# Patient Record
Sex: Female | Born: 1940 | Race: White | Hispanic: No | Marital: Married | Smoking: Former smoker
Health system: Southern US, Community
[De-identification: ages and names within clinical notes are randomized; demographics above are authoritative.]

## PROBLEM LIST (undated history)

## (undated) DIAGNOSIS — I5181 Takotsubo syndrome: Secondary | ICD-10-CM

## (undated) DIAGNOSIS — I671 Cerebral aneurysm, nonruptured: Secondary | ICD-10-CM

## (undated) DIAGNOSIS — M545 Low back pain, unspecified: Secondary | ICD-10-CM

## (undated) DIAGNOSIS — M1990 Unspecified osteoarthritis, unspecified site: Secondary | ICD-10-CM

## (undated) DIAGNOSIS — I509 Heart failure, unspecified: Secondary | ICD-10-CM

## (undated) DIAGNOSIS — I10 Essential (primary) hypertension: Secondary | ICD-10-CM

## (undated) DIAGNOSIS — K589 Irritable bowel syndrome without diarrhea: Secondary | ICD-10-CM

## (undated) DIAGNOSIS — E785 Hyperlipidemia, unspecified: Secondary | ICD-10-CM

## (undated) DIAGNOSIS — E039 Hypothyroidism, unspecified: Secondary | ICD-10-CM

## (undated) DIAGNOSIS — I729 Aneurysm of unspecified site: Secondary | ICD-10-CM

## (undated) HISTORY — DX: Hyperlipidemia, unspecified: E78.5

## (undated) HISTORY — DX: Unspecified osteoarthritis, unspecified site: M19.90

## (undated) HISTORY — DX: Aneurysm of unspecified site: I72.9

## (undated) HISTORY — DX: Cerebral aneurysm, nonruptured: I67.1

## (undated) HISTORY — DX: Essential (primary) hypertension: I10

## (undated) HISTORY — DX: Heart failure, unspecified: I50.9

## (undated) HISTORY — DX: Irritable bowel syndrome, unspecified: K58.9

## (undated) HISTORY — DX: Hypothyroidism, unspecified: E03.9

## (undated) HISTORY — DX: Low back pain, unspecified: M54.50

## (undated) HISTORY — DX: Takotsubo syndrome: I51.81

## (undated) HISTORY — DX: Low back pain: M54.5

## (undated) DEATH — deceased

---
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
## 1962-05-29 HISTORY — PX: ECTOPIC PREGNANCY SURGERY: SHX613

## 1997-12-22 ENCOUNTER — Inpatient Hospital Stay (HOSPITAL_COMMUNITY): Admission: EM | Admit: 1997-12-22 | Discharge: 1997-12-22 | Payer: Self-pay | Admitting: Cardiology

## 1998-03-09 ENCOUNTER — Ambulatory Visit (HOSPITAL_BASED_OUTPATIENT_CLINIC_OR_DEPARTMENT_OTHER): Admission: RE | Admit: 1998-03-09 | Discharge: 1998-03-09 | Payer: Self-pay | Admitting: Orthopedic Surgery

## 2003-05-30 HISTORY — PX: OTHER SURGICAL HISTORY: SHX169

## 2003-05-30 LAB — HM COLONOSCOPY

## 2007-06-29 ENCOUNTER — Encounter (INDEPENDENT_AMBULATORY_CARE_PROVIDER_SITE_OTHER): Payer: Self-pay | Admitting: Internal Medicine

## 2007-06-30 HISTORY — PX: CARDIAC CATHETERIZATION: SHX172

## 2007-07-15 ENCOUNTER — Encounter (INDEPENDENT_AMBULATORY_CARE_PROVIDER_SITE_OTHER): Payer: Self-pay | Admitting: Internal Medicine

## 2008-06-08 ENCOUNTER — Ambulatory Visit: Payer: Self-pay | Admitting: Internal Medicine

## 2008-06-08 DIAGNOSIS — I671 Cerebral aneurysm, nonruptured: Secondary | ICD-10-CM

## 2008-06-08 DIAGNOSIS — M810 Age-related osteoporosis without current pathological fracture: Secondary | ICD-10-CM

## 2008-06-08 DIAGNOSIS — I10 Essential (primary) hypertension: Secondary | ICD-10-CM | POA: Insufficient documentation

## 2008-06-08 DIAGNOSIS — K589 Irritable bowel syndrome without diarrhea: Secondary | ICD-10-CM

## 2008-06-08 DIAGNOSIS — M1990 Unspecified osteoarthritis, unspecified site: Secondary | ICD-10-CM | POA: Insufficient documentation

## 2008-06-08 DIAGNOSIS — E785 Hyperlipidemia, unspecified: Secondary | ICD-10-CM | POA: Insufficient documentation

## 2008-06-08 DIAGNOSIS — E039 Hypothyroidism, unspecified: Secondary | ICD-10-CM | POA: Insufficient documentation

## 2008-06-08 DIAGNOSIS — K5900 Constipation, unspecified: Secondary | ICD-10-CM | POA: Insufficient documentation

## 2008-06-08 DIAGNOSIS — M545 Low back pain: Secondary | ICD-10-CM

## 2008-06-08 DIAGNOSIS — R1013 Epigastric pain: Secondary | ICD-10-CM

## 2008-06-12 ENCOUNTER — Encounter (INDEPENDENT_AMBULATORY_CARE_PROVIDER_SITE_OTHER): Payer: Self-pay | Admitting: Internal Medicine

## 2008-06-15 LAB — CONVERTED CEMR LAB
Albumin: 4.2 g/dL (ref 3.5–5.2)
Alkaline Phosphatase: 75 units/L (ref 39–117)
BUN: 13 mg/dL (ref 6–23)
CO2: 24 meq/L (ref 19–32)
Cholesterol: 222 mg/dL — ABNORMAL HIGH (ref 0–200)
Eosinophils Absolute: 0.3 10*3/uL (ref 0.0–0.7)
Eosinophils Relative: 4 % (ref 0–5)
Glucose, Bld: 99 mg/dL (ref 70–99)
HCT: 44.7 % (ref 36.0–46.0)
HDL: 38 mg/dL — ABNORMAL LOW (ref >39)
LDL Cholesterol: 110 mg/dL — ABNORMAL HIGH (ref 0–99)
Lymphs Abs: 2.7 10*3/uL (ref 0.7–4.0)
MCV: 94.9 fL (ref 78.0–100.0)
Monocytes Relative: 7 % (ref 3–12)
Neutrophils Relative %: 52 % (ref 43–77)
Potassium: 4.6 meq/L (ref 3.5–5.3)
RBC: 4.71 M/uL (ref 3.87–5.11)
Triglycerides: 369 mg/dL — ABNORMAL HIGH (ref <150)
WBC: 7.2 10*3/uL (ref 4.0–10.5)

## 2008-07-06 ENCOUNTER — Encounter (INDEPENDENT_AMBULATORY_CARE_PROVIDER_SITE_OTHER): Payer: Self-pay | Admitting: Internal Medicine

## 2008-07-06 ENCOUNTER — Other Ambulatory Visit: Admission: RE | Admit: 2008-07-06 | Discharge: 2008-07-06 | Payer: Self-pay | Admitting: Internal Medicine

## 2008-07-06 ENCOUNTER — Ambulatory Visit: Payer: Self-pay | Admitting: Internal Medicine

## 2008-07-06 DIAGNOSIS — L538 Other specified erythematous conditions: Secondary | ICD-10-CM | POA: Insufficient documentation

## 2008-07-08 ENCOUNTER — Telehealth (INDEPENDENT_AMBULATORY_CARE_PROVIDER_SITE_OTHER): Payer: Self-pay | Admitting: Internal Medicine

## 2008-07-10 ENCOUNTER — Ambulatory Visit (HOSPITAL_COMMUNITY): Admission: RE | Admit: 2008-07-10 | Discharge: 2008-07-10 | Payer: Self-pay | Admitting: Internal Medicine

## 2008-07-10 ENCOUNTER — Ambulatory Visit: Payer: Self-pay | Admitting: Internal Medicine

## 2008-07-10 DIAGNOSIS — I5181 Takotsubo syndrome: Secondary | ICD-10-CM | POA: Insufficient documentation

## 2008-07-24 ENCOUNTER — Ambulatory Visit: Payer: Self-pay | Admitting: Cardiology

## 2008-10-29 ENCOUNTER — Ambulatory Visit: Payer: Self-pay | Admitting: Internal Medicine

## 2008-12-11 ENCOUNTER — Encounter (INDEPENDENT_AMBULATORY_CARE_PROVIDER_SITE_OTHER): Payer: Self-pay | Admitting: *Deleted

## 2008-12-11 LAB — CONVERTED CEMR LAB
ALT: 16 units/L
AST: 17 units/L
BUN: 16 mg/dL
Cholesterol: 217 mg/dL
Creatinine, Ser: 0.78 mg/dL
Free T4: 0.86 ng/dL
Glucose, Bld: 66 mg/dL
HDL: 34 mg/dL
TSH: 4.749 microintl units/mL
Triglycerides: 335 mg/dL

## 2008-12-14 LAB — CONVERTED CEMR LAB
ALT: 16 units/L (ref 0–35)
Albumin: 4 g/dL (ref 3.5–5.2)
CO2: 21 meq/L (ref 19–32)
Calcium: 8.8 mg/dL (ref 8.4–10.5)
Chloride: 104 meq/L (ref 96–112)
Cholesterol: 217 mg/dL — ABNORMAL HIGH (ref 0–200)
Free T4: 0.86 ng/dL (ref 0.80–1.80)
Glucose, Bld: 66 mg/dL — ABNORMAL LOW (ref 70–99)
Potassium: 4.9 meq/L (ref 3.5–5.3)
Sodium: 141 meq/L (ref 135–145)
Total Bilirubin: 0.5 mg/dL (ref 0.3–1.2)
Total Protein: 7.6 g/dL (ref 6.0–8.3)
Triglycerides: 335 mg/dL — ABNORMAL HIGH (ref <150)

## 2008-12-17 ENCOUNTER — Telehealth (INDEPENDENT_AMBULATORY_CARE_PROVIDER_SITE_OTHER): Payer: Self-pay | Admitting: Internal Medicine

## 2008-12-18 ENCOUNTER — Telehealth (INDEPENDENT_AMBULATORY_CARE_PROVIDER_SITE_OTHER): Payer: Self-pay | Admitting: *Deleted

## 2008-12-21 ENCOUNTER — Telehealth (INDEPENDENT_AMBULATORY_CARE_PROVIDER_SITE_OTHER): Payer: Self-pay | Admitting: Internal Medicine

## 2009-07-19 ENCOUNTER — Encounter (INDEPENDENT_AMBULATORY_CARE_PROVIDER_SITE_OTHER): Payer: Self-pay | Admitting: *Deleted

## 2009-07-23 ENCOUNTER — Ambulatory Visit: Payer: Self-pay | Admitting: Cardiology

## 2009-12-14 ENCOUNTER — Telehealth (INDEPENDENT_AMBULATORY_CARE_PROVIDER_SITE_OTHER): Payer: Self-pay

## 2010-06-19 ENCOUNTER — Encounter: Payer: Self-pay | Admitting: Internal Medicine

## 2010-06-28 NOTE — Miscellaneous (Signed)
Summary: labs cmp,lipids,tsh,t4,12/11/2008  Clinical Lists Changes  Observations: Added new observation of CALCIUM: 8.8 mg/dL (47/82/9562 13:08) Added new observation of ALBUMIN: 4.0 g/dL (65/78/4696 29:52) Added new observation of PROTEIN, TOT: 7.6 g/dL (84/13/2440 10:27) Added new observation of SGPT (ALT): 16 units/L (12/11/2008 10:46) Added new observation of SGOT (AST): 17 units/L (12/11/2008 10:46) Added new observation of ALK PHOS: 60 units/L (12/11/2008 10:46) Added new observation of CREATININE: 0.78 mg/dL (25/36/6440 34:74) Added new observation of BUN: 16 mg/dL (25/95/6387 56:43) Added new observation of BG RANDOM: 66 mg/dL (32/95/1884 16:60) Added new observation of CO2 PLSM/SER: 21 meq/L (12/11/2008 10:46) Added new observation of CL SERUM: 104 meq/L (12/11/2008 10:46) Added new observation of K SERUM: 4.9 meq/L (12/11/2008 10:46) Added new observation of NA: 141 meq/L (12/11/2008 10:46) Added new observation of LDL: 116 mg/dL (63/05/6008 93:23) Added new observation of HDL: 34 mg/dL (55/73/2202 54:27) Added new observation of TRIGLYC TOT: 335 mg/dL (11/19/7626 31:51) Added new observation of CHOLESTEROL: 217 mg/dL (76/16/0737 10:62) Added new observation of TSH: 4.749 microintl units/mL (12/11/2008 10:46) Added new observation of T4, FREE: 0.86 ng/dL (69/48/5462 70:35)

## 2010-06-28 NOTE — Assessment & Plan Note (Signed)
Summary: PAST DUE FOR F/U/TG   Visit Type:  Follow-up Primary Provider:  DR.Pickard  CC:  no complaints today.  History of Present Illness: Jasmine Fields returns today for evaluation and management of her history of Takotsubo adenopathy.  I last saw her in February 2010.  She's having no angina or symptoms of ischemia. She states that her with her medications. Recent blood work demonstrated an elevated LDL and Dr. Tanya Nones placed her on pravastatin.    Current Medications (verified): 1)  Carvedilol 6.25 Mg Tabs (Carvedilol) .Marland Kitchen.. 1 By Mouth Two Times A Day 2)  Pravastatin Sodium 20 Mg Tabs (Pravastatin Sodium) .Marland Kitchen.. 1 By Mouth Once Daily 3)  Bumetanide 1 Mg Tabs (Bumetanide) .... 1/2 By Mouth Once Daily 4)  Lisinopril 40 Mg Tabs (Lisinopril) .Marland Kitchen.. 1 By Mouth Once Daily 5)  Levothyroxine Sodium 100 Mcg Tabs (Levothyroxine Sodium) .... Take 1 Tab Daily 6)  Amitriptyline Hcl 10 Mg Tabs (Amitriptyline Hcl) .Marland Kitchen.. 1 By Mouth At Bedtime As Needed Sleep 7)  [facility] Aspirin 81 Mg Tbec (Aspirin) .Marland Kitchen.. 1 By Mouth Once Daily 8)  Correctol 5 Mg Tbec (Bisacodyl) .Marland Kitchen.. 1 By Mouth As Needed 9)  Nystatin-Triamcinolone 100000-0.1 Unit/gm-% Crea (Nystatin-Triamcinolone) .... Apply To Rash Two Times A Day For 2 Weeks 10)  Vitamin D 1000 Unit Tabs (Cholecalciferol) .... Take 1 Tab Daily  Allergies (verified): No Known Drug Allergies  Past History:  Past Medical History: Last updated: 07/10/2008 Hypertension Hypothyroidism Low back pain Osteoarthritis IBS constipation aneurysm-cerebral artery Osteoporosis Hyperlipidemia Takotsubo's Cardiomyopathy  Past Surgical History: Last updated: 05-Jul-2008 cerebral artery coil-2005 ectopic pregnancy--right SOO-1964 Cholecystectomy-1960 Cardiac Cath--Feb 2009--normal arteries  Family History: Last updated: 07-05-2008 father-deceased-69-heart, diabetes mother-deceased-59-cervical cancer brother-73-in nursing home-alzheimers, CVA  x2 brother-70-HTN sister-63- son-46  Social History: Last updated: 05-Jul-2008 Married lives with spouse Former Smoker-1.5ppd x30 years, quit 2001 Alcohol use-no Drug use-no  Risk Factors: Smoking Status: quit (07-05-08)  Review of Systems       negative other than history of present illness  Vital Signs:  Patient profile:   70 year old female Height:      65 inches Weight:      217 pounds BMI:     36.24 Pulse rate:   61 / minute BP sitting:   121 / 73  (right arm)  Vitals Entered By: Dreama Saa, CNA (July 23, 2009 1:06 PM)  Physical Exam  General:  obese.   Head:  normocephalic and atraumatic Eyes:  PERRLA/EOM intact; conjunctiva and lids normal. Neck:  Neck supple, no JVD. No masses, thyromegaly or abnormal cervical nodes. Chest Jasmine Fields:  no deformities or breast masses noted Lungs:  Clear bilaterally to auscultation and percussion. Heart:  Non-displaced PMI, chest non-tender; regular rate and rhythm, S1, S2 without murmurs, rubs or gallops. Carotid upstroke normal, no bruit. Normal abdominal aortic size, no bruits. Femorals normal pulses, no bruits. Pedals normal pulses. No edema, no varicosities. Msk:  Back normal, normal gait. Muscle strength and tone normal. Pulses:  pulses normal in all 4 extremities Extremities:  No clubbing or cyanosis. Neurologic:  Alert and oriented x 3. Skin:  Intact without lesions or rashes. Psych:  Normal affect.   Impression & Recommendations:  Problem # 1:  TAKOTSUBO SYNDROME (ICD-429.83) Assessment Unchanged  Problem # 2:  HYPERLIPIDEMIA (ICD-272.4) Assessment: Improved  The following medications were removed from the medication list:    Gemfibrozil 600 Mg Tabs (Gemfibrozil) .Marland Kitchen... Take 1 tablet by mouth two times a day Her updated medication list for this problem  includes:    Pravastatin Sodium 20 Mg Tabs (Pravastatin sodium) .Marland Kitchen... 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  Problem # 4:  HYPERTENSION (ICD-401.9) Assessment: Improved  Her updated medication list for this problem includes:    Carvedilol 6.25 Mg Tabs (Carvedilol) .Marland Kitchen... 1 by mouth two times a day    Bumetanide 1 Mg Tabs (Bumetanide) .Marland Kitchen... 1/2 by mouth once daily    Lisinopril 40 Mg Tabs (Lisinopril) .Marland Kitchen... 1 by mouth once daily    [facility] Aspirin 81 Mg Tbec (Aspirin) .Marland Kitchen... 1 by mouth once daily  Patient Instructions: 1)  Your physician recommends that you schedule a follow-up appointment in: 1 year 2)  Your physician recommends that you continue on your current medications as directed. Please refer to the Current Medication list given to you today.

## 2010-06-28 NOTE — Progress Notes (Signed)
Summary: chest discomfort  Phone Note Call from Patient Call back at Home Phone [phone] Call back at [phone]   Caller: pt Reason for Call: Talk to Nurse Summary of Call: per pt call has been having tighness in chest and discomfort in middle of chest. she has been under stress for a couple days. Initial call taken by: Faythe Ghee,  December 14, 2009 9:06 AM  Follow-up for Phone Call        Pt. advised to go to ER if discomfort continues or worsens. Pt. offered first available appt. with Dr. Daleen Squibb or Joni Reining, NP which is in mid Aug. Mrs. Stinnette wanted to be seen sooner and when we could not get her in she stated she would come get her records.  Follow-up by: Larita Fife Via LPN,  December 14, 2009 1:31 PM

## 2010-08-10 ENCOUNTER — Encounter: Payer: Self-pay | Admitting: Physician Assistant

## 2010-08-10 ENCOUNTER — Ambulatory Visit (INDEPENDENT_AMBULATORY_CARE_PROVIDER_SITE_OTHER): Payer: 59 | Admitting: Cardiology

## 2010-08-10 DIAGNOSIS — I5181 Takotsubo syndrome: Secondary | ICD-10-CM

## 2010-08-10 DIAGNOSIS — E785 Hyperlipidemia, unspecified: Secondary | ICD-10-CM

## 2010-08-13 ENCOUNTER — Encounter: Payer: Self-pay | Admitting: Family Medicine

## 2010-08-13 DIAGNOSIS — Z9049 Acquired absence of other specified parts of digestive tract: Secondary | ICD-10-CM | POA: Insufficient documentation

## 2010-08-13 DIAGNOSIS — I671 Cerebral aneurysm, nonruptured: Secondary | ICD-10-CM

## 2010-08-13 DIAGNOSIS — I5181 Takotsubo syndrome: Secondary | ICD-10-CM
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
## 2010-11-18 ENCOUNTER — Encounter: Payer: Self-pay | Admitting: Family Medicine

## 2010-11-18 DIAGNOSIS — I10 Essential (primary) hypertension: Secondary | ICD-10-CM | POA: Insufficient documentation

## 2010-11-18 DIAGNOSIS — E785 Hyperlipidemia, unspecified: Secondary | ICD-10-CM | POA: Insufficient documentation

## 2010-11-18 DIAGNOSIS — E039 Hypothyroidism, unspecified: Secondary | ICD-10-CM | POA: Insufficient documentation

## 2010-11-18 DIAGNOSIS — I509 Heart failure, unspecified: Secondary | ICD-10-CM | POA: Insufficient documentation

## 2010-11-18 DIAGNOSIS — I729 Aneurysm of unspecified site: Secondary | ICD-10-CM | POA: Insufficient documentation

## 2010-12-08 ENCOUNTER — Encounter: Payer: Self-pay | Admitting: Family Medicine

## 2011-03-03 ENCOUNTER — Encounter: Payer: Self-pay | Admitting: Cardiology

## 2011-03-06 ENCOUNTER — Encounter: Payer: Self-pay | Admitting: Cardiology

## 2011-10-17 ENCOUNTER — Telehealth: Payer: Self-pay | Admitting: *Deleted

## 2011-10-17 NOTE — Telephone Encounter (Signed)
Received TFC from patient, who states nausea, sweating and left arm pain and numbness.  Advised patient to be taken to the nearest emergency department for evaluation, due to the nature of her symptoms.

## 2011-10-18 DIAGNOSIS — R079 Chest pain, unspecified: Secondary | ICD-10-CM

## 2011-10-26 ENCOUNTER — Ambulatory Visit (INDEPENDENT_AMBULATORY_CARE_PROVIDER_SITE_OTHER): Payer: 59 | Admitting: Adult Health

## 2011-10-26 ENCOUNTER — Encounter: Payer: Self-pay | Admitting: Adult Health

## 2011-10-26 VITALS — BP 142/73 | HR 59 | Resp 16 | Ht 65.0 in | Wt 199.0 lb

## 2011-10-26 DIAGNOSIS — I10 Essential (primary) hypertension: Secondary | ICD-10-CM

## 2011-10-26 DIAGNOSIS — I5181 Takotsubo syndrome: Secondary | ICD-10-CM

## 2011-10-26 MED ORDER — NITROGLYCERIN 0.4 MG SL SUBL: 0.4000 mg | SUBLINGUAL_TABLET | SUBLINGUAL | Status: DC | PRN
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
## 2011-10-26 NOTE — Assessment & Plan Note (Addendum)
She continues on coreg 6.25 mg BID. I have added NTG to her medication regimen for angina symptoms, although what she has described on recent admission appears to be more musculoskeletal in etiology. Recommend cervical spine evaluation. She continues tense at home with her husband's illness being treated for leukemia, with mood swings associated with chemo. She is going to talk to her PCP about antianxiety medications on next appointment.

## 2011-10-26 NOTE — Patient Instructions (Signed)
Your physician recommends that you schedule a follow-up appointment in: 6 months  

## 2011-10-26 NOTE — Assessment & Plan Note (Signed)
BP is mildly elevated on this office visit. Will continue to monitor. If remains elevated, may consider adjusting medications for better control with goal of 135/60. She verbalizes understanding. Will continue lisinopril 40 mg as directed.Most recent labs at [facility] Creatinine 0.91, potassium 4.1.

## 2011-11-14 ENCOUNTER — Ambulatory Visit: Payer: 59 | Admitting: Cardiology

## 2012-05-13 ENCOUNTER — Ambulatory Visit: Payer: 59 | Admitting: Adult Health

## 2012-05-20 ENCOUNTER — Ambulatory Visit: Payer: 59 | Admitting: Adult Health

## 2012-06-05 ENCOUNTER — Ambulatory Visit: Payer: 59 | Admitting: Adult Health

## 2012-06-07 ENCOUNTER — Encounter: Payer: Self-pay | Admitting: Adult Health

## 2012-06-07 ENCOUNTER — Ambulatory Visit (INDEPENDENT_AMBULATORY_CARE_PROVIDER_SITE_OTHER): Payer: 59 | Admitting: Adult Health

## 2012-06-07 VITALS — BP 118/85 | HR 66 | Ht 64.5 in | Wt 200.5 lb

## 2012-06-07 DIAGNOSIS — E785 Hyperlipidemia, unspecified: Secondary | ICD-10-CM

## 2012-06-07 DIAGNOSIS — I10 Essential (primary) hypertension: Secondary | ICD-10-CM

## 2012-06-07 DIAGNOSIS — I5181 Takotsubo syndrome: Secondary | ICD-10-CM

## 2012-06-07 NOTE — Addendum Note (Signed)
Addended by: Reather Laurence A on: 06/07/2012 04:24 PM   Modules accepted: Orders

## 2012-06-07 NOTE — Progress Notes (Signed)
HPI: Jasmine Fields is a 72 y/o patietn of Dr. Daleen Squibb we are following  For ongoing assessment and treatment for Taku-Tsubo cardiomyopathy, hypertension, and dyslipidemia with history of hypothyroidism. She comes today without complaint of chest discomfort. She continues to be depressed and anxious, and is caring for her husband who is on chemo.She states she has family support who are helpful. She is medically complaint and has had no ER visits or hospitalizations since being seen in clinic last. She is followed by Dr.Pickard for PCP needs and has had CBC and lipid profile completed in December, no documentation of BMET. .  Allergies  Allergen Reactions  . Alendronate Sodium     Upset stomach   . Amlodipine     swelling  . Boniva (Ibandronate Sodium)     Upset stomach  . Latex     Current Outpatient Prescriptions  Medication Sig Dispense Refill  . amitriptyline (ELAVIL) 10 MG tablet Take 10 mg by mouth at bedtime.        Marland Kitchen aspirin 81 MG tablet Take 81 mg by mouth daily.        . bumetanide (BUMEX) 1 MG tablet Take 1 mg by mouth daily. 1/2 pill a day       . carvedilol (COREG) 6.25 MG tablet Take 6.25 mg by mouth 2 (two) times daily with a meal.        . Cholecalciferol (VITAMIN D3) 2000 UNITS TABS Take by mouth daily.        Marland Kitchen levothyroxine (SYNTHROID, LEVOTHROID) 100 MCG tablet Take 100 mcg by mouth daily.        Marland Kitchen lisinopril (PRINIVIL,ZESTRIL) 40 MG tablet Take 40 mg by mouth daily.        . nitroGLYCERIN (NITROSTAT) 0.4 MG SL tablet Place 1 tablet (0.4 mg total) under the tongue every 5 (five) minutes as needed.  100 tablet  0  . simvastatin (ZOCOR) 40 MG tablet Take 40 mg by mouth at bedtime.          Past Medical History  Diagnosis Date  . Hypothyroidism   . Aneurysm   . Hypertension   . Hyperlipidemia   . CHF (congestive heart failure)   . Low back pain   . Osteoarthritis   . IBS (irritable bowel syndrome)     With constipation  . Aneurysm of anterior cerebral artery   .  Osteoporosis   . Takotsubo cardiomyopathy     Past Surgical History  Procedure Date  . Cerebral artery coil 2005  . Ectopic pregnancy surgery 1964    Right, SOO  . Cholecystectomy 1960  . Cardiac catheterization 06/2007    Normal arteries    RUE:AVWUJW of systems complete and found to be negative unless listed above  PHYSICAL EXAM BP 118/85  Pulse 66  Ht 5' 4.5" (1.638 m)  Wt 200 lb 8 oz (90.946 kg)  BMI 33.88 kg/m2  SpO2 96%  General: Well developed, well nourished, in no acute distress Head: Eyes PERRLA, No xanthomas.   Normal cephalic and atramatic  Lungs: Clear bilaterally to auscultation and percussion. Heart: HRRR S1 S2, without MRG.  Pulses are 2+ & equal.            No carotid bruit. No JVD.  No abdominal bruits. No femoral bruits. Abdomen: Bowel sounds are positive, abdomen soft and non-tender without masses or                  Hernia's noted. Msk:  Back normal,  normal gait. Normal strength and tone for age. Extremities: No clubbing, cyanosis or edema.  DP +1 Neuro: Alert and oriented X 3. Psych:  Flat affect, responds appropriately  EKG:NSR with T-wave inversion in the precordial leads.  ASSESSMENT AND PLAN

## 2012-06-07 NOTE — Patient Instructions (Addendum)
Your physician recommends that you schedule a follow-up appointment in: 6 months  ADDENDUM - BMET - PT WILL BE NOTIFIED BY PHONE

## 2012-06-07 NOTE — Assessment & Plan Note (Signed)
She offers no recurrence of symptoms since being seen in the office last, with no hospitalizations. She will continue coreg and prn nitrates.

## 2012-06-07 NOTE — Assessment & Plan Note (Signed)
Labs from Dec 2013  TC: 179, TG 258, HDL 25 LDL 91.  She is to have CMET drawn for evaluation of liver fx as we do not have documentation of this with lipids from Dr. Caren Macadam office.

## 2012-06-07 NOTE — Assessment & Plan Note (Signed)
Blood pressure is well controlled at present, the best I have seen it. She will remain on lisinopril, bur I will have BMET checked for kidney function along with lytes. She will be seen again in 6 months unless she is symptomatic.

## 2012-06-07 NOTE — Progress Notes (Deleted)
Name: Jasmine Fields    DOB: 1940/06/14  Age: 72 y.o.  MR#: [id number]       PCP:  Leo Grosser, MD      Insurance: @PAYORNAME @   CC:   NOTES UNDER A LOT OF STRESS PER SON IN HOSPITAL [place] 5 DAYS AGO, NOTED DIZZY SPELLS STARTED 2-3 DAYS AGO BID WHILE SITTING OR STANDING TO WALK  VS BP 120/87  Pulse 64  Ht 5' 4.5" (1.638 m)  Wt 200 lb 8 oz (90.946 kg)  BMI 33.88 kg/m2  SpO2 96%  Weights Current Weight  06/07/12 200 lb 8 oz (90.946 kg)  10/26/11 199 lb (90.266 kg)  08/10/10 196 lb (88.905 kg)    Blood Pressure  BP Readings from Last 3 Encounters:  06/07/12 120/87  10/26/11 142/73  08/10/10 137/81     Admit date:  (Not on file) Last encounter with RMR:  Visit date not found   Allergy Allergies  Allergen Reactions  . Alendronate Sodium     Upset stomach   . Amlodipine     swelling  . Boniva (Ibandronate Sodium)     Upset stomach  . Latex     Current Outpatient Prescriptions  Medication Sig Dispense Refill  . amitriptyline (ELAVIL) 10 MG tablet Take 10 mg by mouth at bedtime.        Marland Kitchen aspirin 81 MG tablet Take 81 mg by mouth daily.        . bumetanide (BUMEX) 1 MG tablet Take 1 mg by mouth daily. 1/2 pill a day       . carvedilol (COREG) 6.25 MG tablet Take 6.25 mg by mouth 2 (two) times daily with a meal.        . Cholecalciferol (VITAMIN D3) 2000 UNITS TABS Take by mouth daily.        Marland Kitchen levothyroxine (SYNTHROID, LEVOTHROID) 100 MCG tablet Take 100 mcg by mouth daily.        Marland Kitchen lisinopril (PRINIVIL,ZESTRIL) 40 MG tablet Take 40 mg by mouth daily.        . nitroGLYCERIN (NITROSTAT) 0.4 MG SL tablet Place 1 tablet (0.4 mg total) under the tongue every 5 (five) minutes as needed.  100 tablet  0  . simvastatin (ZOCOR) 40 MG tablet Take 40 mg by mouth at bedtime.          Discontinued Meds:   There are no discontinued medications.  Patient Active Problem List  Diagnosis  . HYPOTHYROIDISM  . HYPERLIPIDEMIA  . HYPERTENSION  . Takotsubo syndrome  . Cerebral  aneurysm, nonruptured  . CONSTIPATION  . IRRITABLE BOWEL SYNDROME  . INTERTRIGO, CANDIDAL  . OSTEOARTHRITIS  . LOW BACK PAIN  . POSTMENOPAUSAL OSTEOPOROSIS  . EPIGASTRIC PAIN  . History of cholecystectomy  . Hypothyroidism  . Aneurysm  . Hypertension  . Hyperlipidemia  . CHF (congestive heart failure)    LABS No visits with results within 3 Month(s) from this visit. Latest known visit with results is:  Abstract on 11/18/2010  Component Date Value  . HM Mammogram 03/29/2010 ..   . HM Colonoscopy 05/30/2003 ..   . HM Dexa Scan 05/29/2001 .Marland Kitchen      Results for this Opt Visit:     Results for orders placed in visit on 11/18/10  HM MAMMOGRAPHY      Component Value Range   HM Mammogram ..    HM COLONOSCOPY      Component Value Range   HM Colonoscopy .Marland Kitchen    HM  DEXA SCAN      Component Value Range   HM Dexa Scan .Marland Kitchen      EKG Orders placed in visit on 06/07/12  . EKG 12-LEAD     Prior Assessment and Plan Problem List as of 06/07/2012          HYPOTHYROIDISM   HYPERLIPIDEMIA   HYPERTENSION   Last Assessment & Plan Note   10/26/2011 Office Visit Signed 10/26/2011  1:44 PM by Jodelle Gross, NP    BP is mildly elevated on this office visit. Will continue to monitor. If remains elevated, may consider adjusting medications for better control with goal of 135/60. She verbalizes understanding. Will continue lisinopril 40 mg as directed.Most recent labs at [facility] Creatinine 0.91, potassium 4.1.     Takotsubo syndrome   Last Assessment & Plan Note   10/26/2011 Office Visit Addendum 10/26/2011  1:46 PM by Jodelle Gross, NP    She continues on coreg 6.25 mg BID. I have added NTG to her medication regimen for angina symptoms, although what she has described on recent admission appears to be more musculoskeletal in etiology. Recommend cervical spine evaluation. She continues tense at home with her husband's illness being treated for leukemia, with mood swings associated with  chemo. She is going to talk to her PCP about antianxiety medications on next appointment.    Cerebral aneurysm, nonruptured   CONSTIPATION   IRRITABLE BOWEL SYNDROME   INTERTRIGO, CANDIDAL   OSTEOARTHRITIS   LOW BACK PAIN   POSTMENOPAUSAL OSTEOPOROSIS   EPIGASTRIC PAIN   History of cholecystectomy   Hypothyroidism   Aneurysm   Hypertension   Hyperlipidemia   CHF (congestive heart failure)       Imaging: No results found.   FRS Calculation: Score not calculated. Missing: Total Cholesterol

## 2012-06-08 LAB — BASIC METABOLIC PANEL
BUN: 16 mg/dL (ref 6–23)
CO2: 27 mEq/L (ref 19–32)
Chloride: 102 mEq/L (ref 96–112)
Glucose, Bld: 102 mg/dL — ABNORMAL HIGH (ref 70–99)
Potassium: 4 mEq/L (ref 3.5–5.3)

## 2012-06-10 ENCOUNTER — Encounter: Payer: Self-pay | Admitting: *Deleted

## 2012-09-16 ENCOUNTER — Ambulatory Visit (INDEPENDENT_AMBULATORY_CARE_PROVIDER_SITE_OTHER): Payer: Self-pay | Admitting: Physician Assistant

## 2012-09-16 ENCOUNTER — Encounter: Payer: Self-pay | Admitting: Physician Assistant

## 2012-09-16 VITALS — BP 162/90 | HR 80 | Temp 98.1°F | Resp 18 | Ht 62.75 in | Wt 197.0 lb

## 2012-09-16 DIAGNOSIS — A499 Bacterial infection, unspecified: Secondary | ICD-10-CM

## 2012-09-16 DIAGNOSIS — J988 Other specified respiratory disorders: Secondary | ICD-10-CM

## 2012-09-16 DIAGNOSIS — B9689 Other specified bacterial agents as the cause of diseases classified elsewhere: Secondary | ICD-10-CM

## 2012-09-16 MED ORDER — BENZONATATE 200 MG PO CAPS: 200.0000 mg | ORAL_CAPSULE | Freq: Two times a day (BID) | ORAL | Status: DC | PRN

## 2012-09-16 MED ORDER — PREDNISONE 20 MG PO TABS: ORAL_TABLET | ORAL | Status: DC

## 2012-09-16 MED ORDER — AZITHROMYCIN 250 MG PO TABS: ORAL_TABLET | ORAL | Status: DC

## 2012-09-16 NOTE — Progress Notes (Signed)
Patient ID: CYLA HALUSKA MRN: [id number], DOB: 06-Sep-1940, 72 y.o. Date of Encounter: 09/16/2012, 12:17 PM    Chief Complaint:  Chief Complaint  Patient presents with  . sick  ?bronchitits    in [place] ED over weekend  gave cough med and penicilin at ED but made her sick so stopped taking     HPI: 72 y.o. year old female says these symptoms started 7 days ago. Hoarseness, Cough productive of thick dark phlegm. Also head/nasal congestion with thick yellow mucus. Had some sore throat but this has resolved except some sore throat when has cough. No known fever or chills. Went to [facility] ER over weekend-they prescribed penicillin and cough med with hydrocodone. She took both meds within one hour of each other. After taking both meds, developed vomiting and diarrhea so stopped both meds. Not sure which med caused the v/d. No h/o known rxn to either previously.   Home Meds: Current Outpatient Prescriptions on File Prior to Visit  Medication Sig Dispense Refill  . amitriptyline (ELAVIL) 10 MG tablet Take 10 mg by mouth at bedtime.        Marland Kitchen aspirin 81 MG tablet Take 81 mg by mouth daily.        . bumetanide (BUMEX) 1 MG tablet Take 1 mg by mouth daily. 1/2 pill a day       . carvedilol (COREG) 6.25 MG tablet Take 6.25 mg by mouth 2 (two) times daily with a meal.        . levothyroxine (SYNTHROID, LEVOTHROID) 100 MCG tablet Take 100 mcg by mouth daily.        Marland Kitchen lisinopril (PRINIVIL,ZESTRIL) 40 MG tablet Take 40 mg by mouth daily.        . nitroGLYCERIN (NITROSTAT) 0.4 MG SL tablet Place 1 tablet (0.4 mg total) under the tongue every 5 (five) minutes as needed.  100 tablet  0  . simvastatin (ZOCOR) 40 MG tablet Take 40 mg by mouth at bedtime.        . Cholecalciferol (VITAMIN D3) 2000 UNITS TABS Take by mouth daily.         No current facility-administered medications on file prior to visit.    Allergies:  Allergies  Allergen Reactions  . Alendronate Sodium     Upset stomach    . Amlodipine     swelling  . Boniva (Ibandronate Sodium)     Upset stomach  . Latex       Review of Systems:See HPI for pertinent ros. All others negative.   Physical Exam: Blood pressure 162/90, pulse 80, temperature 98.1 F (36.7 C), temperature source Oral, resp. rate 18, height 5' 2.75" (1.594 m), weight 197 lb (89.359 kg)., Body mass index is 35.17 kg/(m^2).SAO2 on RA at Rest; 94-98% (fluctuates b/t these) General: Well developed, well nourished,WF in no acute distress. HEENT: Normocephalic, atraumatic, eyes without discharge, sclera non-icteric, nares are without discharge. Bilateral auditory canals clear, TM's are without perforation, pearly grey and translucent with reflective cone of light bilaterally. Oral cavity moist, posterior pharynx without exudate, erythema, peritonsillar abscess, or post nasal drip.No tenderness with percussion of sinuses.  Neck: Supple. No thyromegaly. Full ROM. No lymphadenopathy. Lungs: Every time she takes a deep breath, this causes cough. She has harsh coarse breath sounds throughout with some slight wheezes. No dyspnea. No use of accessory muscles.  Heart: Regular rhythm. No murmurs, rubs, or gallops. Msk:  Strength and tone normal for age. Extremities/Skin: Warm and dry. No clubbing or cyanosis.  No edema. No rashes or suspicious lesions. Neuro: Alert and oriented X 3. Moves all extremities spontaneously. Gait is normal. CNII-XII grossly in tact. Psych:  Responds to questions appropriately with a normal affect.     ASSESSMENT AND PLAN:  72 y.o. year old female with  1. Bacterial respiratory infection - azithromycin (ZITHROMAX) 250 MG tablet; ADay 1 : Take 2. Days 2-5: Take 1 daily  Dispense: 6 tablet; Refill: 0 - benzonatate (TESSALON) 200 MG capsule; Take 1 capsule (200 mg total) by mouth 2 (two) times daily as needed for cough.  Dispense: 20 capsule; Refill: 0 - predniSONE (DELTASONE) 20 MG tablet; Take 3 daily for 2 days, then 2 daily for 2  days, then 1 daily for 2 days.  Dispense: 12 tablet; Refill: 0 Mucinex DM during day-Gave her some samples.   2-HTN: Uncontrolled. She reports that she has not taken BP med for past 2 days b/c of being sick. Discussed with her-must take daily. She is to go home and take dose now then daily.  If worsens or if does not resolve f/u.  [address] [place], [place], [facility] 09/16/2012 12:17 PM

## 2012-09-22 ENCOUNTER — Inpatient Hospital Stay (HOSPITAL_COMMUNITY)
Admission: EM | Admit: 2012-09-22 | Discharge: 2012-09-26 | DRG: 153 | Disposition: A | Discharge status: Home / Self Care | Payer: Medicare PPO | Attending: Otolaryngology | Admitting: Otolaryngology

## 2012-09-22 ENCOUNTER — Emergency Department (HOSPITAL_COMMUNITY): Payer: Medicare PPO

## 2012-09-22 ENCOUNTER — Encounter (HOSPITAL_COMMUNITY): Payer: Self-pay | Admitting: Emergency Medicine

## 2012-09-22 DIAGNOSIS — Z7982 Long term (current) use of aspirin: Secondary | ICD-10-CM

## 2012-09-22 DIAGNOSIS — M1990 Unspecified osteoarthritis, unspecified site: Secondary | ICD-10-CM | POA: Diagnosis present

## 2012-09-22 DIAGNOSIS — K589 Irritable bowel syndrome without diarrhea: Secondary | ICD-10-CM | POA: Diagnosis present

## 2012-09-22 DIAGNOSIS — I509 Heart failure, unspecified: Secondary | ICD-10-CM | POA: Diagnosis present

## 2012-09-22 DIAGNOSIS — Z87891 Personal history of nicotine dependence: Secondary | ICD-10-CM

## 2012-09-22 DIAGNOSIS — I5181 Takotsubo syndrome: Secondary | ICD-10-CM | POA: Diagnosis present

## 2012-09-22 DIAGNOSIS — J029 Acute pharyngitis, unspecified: Principal | ICD-10-CM | POA: Diagnosis present

## 2012-09-22 DIAGNOSIS — Z8249 Family history of ischemic heart disease and other diseases of the circulatory system: Secondary | ICD-10-CM

## 2012-09-22 DIAGNOSIS — I10 Essential (primary) hypertension: Secondary | ICD-10-CM | POA: Diagnosis present

## 2012-09-22 DIAGNOSIS — Z833 Family history of diabetes mellitus: Secondary | ICD-10-CM

## 2012-09-22 DIAGNOSIS — E785 Hyperlipidemia, unspecified: Secondary | ICD-10-CM | POA: Diagnosis present

## 2012-09-22 DIAGNOSIS — Z823 Family history of stroke: Secondary | ICD-10-CM

## 2012-09-22 DIAGNOSIS — K1120 Sialoadenitis, unspecified: Secondary | ICD-10-CM | POA: Diagnosis present

## 2012-09-22 DIAGNOSIS — K5900 Constipation, unspecified: Secondary | ICD-10-CM | POA: Diagnosis present

## 2012-09-22 DIAGNOSIS — Z8049 Family history of malignant neoplasm of other genital organs: Secondary | ICD-10-CM

## 2012-09-22 DIAGNOSIS — Z79899 Other long term (current) drug therapy: Secondary | ICD-10-CM

## 2012-09-22 DIAGNOSIS — M810 Age-related osteoporosis without current pathological fracture: Secondary | ICD-10-CM | POA: Diagnosis present

## 2012-09-22 DIAGNOSIS — E039 Hypothyroidism, unspecified: Secondary | ICD-10-CM | POA: Diagnosis present

## 2012-09-22 DIAGNOSIS — L0211 Cutaneous abscess of neck: Secondary | ICD-10-CM

## 2012-09-22 LAB — CBC WITH DIFFERENTIAL/PLATELET
Basophils Absolute: 0.1 10*3/uL (ref 0.0–0.1)
Basophils Relative: 0 % (ref 0–1)
Eosinophils Relative: 1 % (ref 0–5)
HCT: 43.2 % (ref 36.0–46.0)
Lymphocytes Relative: 17 % (ref 12–46)
MCHC: 34.5 g/dL (ref 30.0–36.0)
MCV: 87.3 fL (ref 78.0–100.0)
Monocytes Absolute: 1.5 10*3/uL — ABNORMAL HIGH (ref 0.1–1.0)
RDW: 15.1 % (ref 11.5–15.5)

## 2012-09-22 LAB — MRSA PCR SCREENING: MRSA by PCR: NEGATIVE

## 2012-09-22 LAB — COMPREHENSIVE METABOLIC PANEL
AST: 15 U/L (ref 0–37)
CO2: 25 mEq/L (ref 19–32)
Calcium: 9.3 mg/dL (ref 8.4–10.5)
Creatinine, Ser: 0.69 mg/dL (ref 0.50–1.10)
GFR calc non Af Amer: 86 mL/min — ABNORMAL LOW (ref >90)

## 2012-09-22 MED ORDER — IOHEXOL 300 MG/ML  SOLN
75.0000 mL | Freq: Once | INTRAMUSCULAR | Status: AC | PRN
  Administered 2012-09-22: 75 mL via INTRAVENOUS

## 2012-09-22 MED ORDER — ONDANSETRON HCL 4 MG/2ML IJ SOLN: 4.0000 mg | Freq: Four times a day (QID) | INTRAMUSCULAR | Status: DC | PRN

## 2012-09-22 MED ORDER — PIPERACILLIN-TAZOBACTAM 3.375 G IVPB
3.3750 g | Freq: Three times a day (TID) | INTRAVENOUS | Status: DC
  Administered 2012-09-22 – 2012-09-25 (×8): 3.375 g via INTRAVENOUS
  Filled 2012-09-22 (×11): qty 50

## 2012-09-22 MED ORDER — DEXTROSE-NACL 5-0.45 % IV SOLN
INTRAVENOUS | Status: DC
  Administered 2012-09-22: 20:00:00 via INTRAVENOUS
  Administered 2012-09-23: 1000 mL via INTRAVENOUS
  Administered 2012-09-23: 06:00:00 via INTRAVENOUS
  Administered 2012-09-24: 1000 mL via INTRAVENOUS
  Administered 2012-09-24 – 2012-09-25 (×4): via INTRAVENOUS

## 2012-09-22 MED ORDER — LEVOTHYROXINE SODIUM 100 MCG PO TABS
100.0000 ug | ORAL_TABLET | Freq: Every day | ORAL | Status: DC
  Administered 2012-09-23 – 2012-09-26 (×4): 100 ug via ORAL
  Filled 2012-09-22 (×6): qty 1

## 2012-09-22 MED ORDER — AMITRIPTYLINE HCL 10 MG PO TABS
10.0000 mg | ORAL_TABLET | Freq: Every day | ORAL | Status: DC
  Administered 2012-09-22: 10 mg via ORAL
  Filled 2012-09-22 (×7): qty 1

## 2012-09-22 MED ORDER — SODIUM CHLORIDE 0.9 % IV BOLUS (SEPSIS)
1000.0000 mL | Freq: Once | INTRAVENOUS | Status: AC
  Administered 2012-09-22: 1000 mL via INTRAVENOUS

## 2012-09-22 MED ORDER — VANCOMYCIN HCL IN DEXTROSE 1-5 GM/200ML-% IV SOLN: 1000.0000 mg | Freq: Once | INTRAVENOUS | Status: DC

## 2012-09-22 MED ORDER — BUMETANIDE 1 MG PO TABS
1.0000 mg | ORAL_TABLET | Freq: Every day | ORAL | Status: DC
  Administered 2012-09-23 – 2012-09-26 (×4): 1 mg via ORAL
  Filled 2012-09-22 (×6): qty 1

## 2012-09-22 MED ORDER — LISINOPRIL 40 MG PO TABS
40.0000 mg | ORAL_TABLET | Freq: Every day | ORAL | Status: DC
  Administered 2012-09-23 – 2012-09-26 (×4): 40 mg via ORAL
  Filled 2012-09-22 (×4): qty 1

## 2012-09-22 MED ORDER — HYDROCODONE-ACETAMINOPHEN 5-325 MG PO TABS: 1.0000 | ORAL_TABLET | ORAL | Status: DC | PRN

## 2012-09-22 MED ORDER — ASPIRIN EC 81 MG PO TBEC
81.0000 mg | DELAYED_RELEASE_TABLET | Freq: Every day | ORAL | Status: DC
Start: 2012-09-22 — End: 2012-09-26
  Administered 2012-09-23 – 2012-09-26 (×4): 81 mg via ORAL
  Filled 2012-09-22 (×6): qty 1

## 2012-09-22 MED ORDER — PIPERACILLIN-TAZOBACTAM 3.375 G IVPB
3.3750 g | Freq: Once | INTRAVENOUS | Status: AC
  Administered 2012-09-22: 3.375 g via INTRAVENOUS
  Filled 2012-09-22: qty 50

## 2012-09-22 MED ORDER — CARVEDILOL 6.25 MG PO TABS
6.2500 mg | ORAL_TABLET | Freq: Two times a day (BID) | ORAL | Status: DC
  Administered 2012-09-22 – 2012-09-26 (×8): 6.25 mg via ORAL
  Filled 2012-09-22 (×12): qty 1

## 2012-09-22 MED ORDER — ASPIRIN 81 MG PO TABS: 81.0000 mg | ORAL_TABLET | Freq: Every day | ORAL | Status: DC

## 2012-09-22 NOTE — ED Notes (Signed)
Pt resting quietly at the time. Vital signs stable. 4/10 pain to throat at the time. Pt is alert and oriented x4. Family remains at bedside.

## 2012-09-22 NOTE — ED Notes (Signed)
Pt transported to CT, to have x2 blood cultures drawn before antibiotics are started per Dr. Judd Lien.

## 2012-09-22 NOTE — ED Notes (Addendum)
States had a fever last night and Friday night. States unable to swallow anything, even water-

## 2012-09-22 NOTE — ED Notes (Signed)
Pt up to bathroom without difficulty. Vital signs stable. ENT cart set up at the bedside.

## 2012-09-22 NOTE — ED Notes (Addendum)
Lab at bedside to draw, vital signs stable. Pt resting quietly at the time. Husband at bedside.

## 2012-09-22 NOTE — ED Notes (Signed)
ENT at the bedside

## 2012-09-22 NOTE — Progress Notes (Signed)
Patient denies pain at this time, denies SOB, does state that her "throat feels sore and swollen nearly shut." O2 sat 92% - no obvious signs of respiratory distress noted. Respirations even and non labored with a rate of 16.   Spoke to Dr. Belinda Block, [facility] physician, she states that Step Down is the appropriate level of bed placement for this patients condition, despite CT that reveals a "SUPRAGLOTTIC AIRWAY IN THE VICINITY IS ABNORMALLY NARROW AND COULD BE THREATENED." [facility] Physician states that she concurred with ED physician and Dr. Jearld Fenton, whom after a visual fiberoptic exam, determined Step Down was the appropriate level of care. Patient has orders for IV antibiotics in place.

## 2012-09-22 NOTE — ED Notes (Signed)
Pt reports began new medications on Tuesday for bronchitis. Pt reports feeling like her throat is going to close up since Friday. Pt c/o shortness of breath. Pt has swelling and redness to neck area. Pt able to talk in complete sentences.

## 2012-09-22 NOTE — Progress Notes (Signed)
Pt to 2603 from ED on room air. O2 sat down to 88%. O2 applied at 2lpm, O2 sat increased to 96%. Dr. Belinda Block, [facility] Physician notified.

## 2012-09-22 NOTE — ED Notes (Signed)
Pt to have bed assignment changed. Consulting civil engineer, house coverage and rapid response nurse notified.

## 2012-09-22 NOTE — ED Provider Notes (Signed)
History     CSN: [id number]  Arrival date & time 09/22/12  7829   First MD Initiated Contact with Patient 09/22/12 1016      Chief Complaint  Patient presents with  . Allergic Reaction  . Shortness of Breath    (Consider location/radiation/quality/duration/timing/severity/associated sxs/prior treatment) HPI Comments: Patient was seen by her pcp five days ago for what was thought to be bronchitis.  She was started on antibiotics.  She is now having swelling and pain in the anterior neck.  She is now finding it uncomfortable to breathe or swallow.  She reports fever yesterday.  Patient is a 72 y.o. female presenting with allergic reaction. The history is provided by the patient.  Allergic Reaction The primary symptoms do not include shortness of breath. Primary symptoms comment: neck pain and swelling. The current episode started 2 days ago. The problem has been gradually worsening. This is a new problem.  The onset of the reaction was associated with a new medication.    Past Medical History  Diagnosis Date  . Hypothyroidism   . Aneurysm   . Hypertension   . Hyperlipidemia   . CHF (congestive heart failure)   . Low back pain   . Osteoarthritis   . IBS (irritable bowel syndrome)     With constipation  . Aneurysm of anterior cerebral artery   . Osteoporosis   . Takotsubo cardiomyopathy     Past Surgical History  Procedure Laterality Date  . Cerebral artery coil  2005  . Ectopic pregnancy surgery  1964    Right, SOO  . Cholecystectomy  1960  . Cardiac catheterization  06/2007    Normal arteries    Family History  Problem Relation Age of Onset  . Cervical cancer Mother   . Heart disease Father   . Diabetes Father   . Alzheimer's disease Brother   . Stroke Brother     x2  . Hypertension Brother     History  Substance Use Topics  . Smoking status: Former Smoker -- 1.50 packs/day for 30 years    Quit date: 05/30/1999  . Smokeless tobacco: Not on file  .  Alcohol Use: No    OB History   Grav Para Term Preterm Abortions TAB SAB Ect Mult Living                  Review of Systems  Constitutional: Positive for fever and chills.  HENT: Positive for neck pain. Negative for sore throat.   Respiratory: Negative for shortness of breath.   All other systems reviewed and are negative.    Allergies  Alendronate sodium; Amlodipine; Boniva; and Latex  Home Medications   Current Outpatient Rx  Name  Route  Sig  Dispense  Refill  . amitriptyline (ELAVIL) 10 MG tablet   Oral   Take 10 mg by mouth at bedtime.           Marland Kitchen aspirin 81 MG tablet   Oral   Take 81 mg by mouth daily.           Marland Kitchen azithromycin (ZITHROMAX) 250 MG tablet      ADay 1 : Take 2. Days 2-5: Take 1 daily   6 tablet   0   . benzonatate (TESSALON) 200 MG capsule   Oral   Take 1 capsule (200 mg total) by mouth 2 (two) times daily as needed for cough.   20 capsule   0   . bumetanide (BUMEX) 1 MG  tablet   Oral   Take 1 mg by mouth daily. 1/2 pill a day          . carvedilol (COREG) 6.25 MG tablet   Oral   Take 6.25 mg by mouth 2 (two) times daily with a meal.           . Cholecalciferol (VITAMIN D3) 2000 UNITS TABS   Oral   Take by mouth daily.           Marland Kitchen levothyroxine (SYNTHROID, LEVOTHROID) 100 MCG tablet   Oral   Take 100 mcg by mouth daily.           Marland Kitchen lisinopril (PRINIVIL,ZESTRIL) 40 MG tablet   Oral   Take 40 mg by mouth daily.           . nitroGLYCERIN (NITROSTAT) 0.4 MG SL tablet   Sublingual   Place 1 tablet (0.4 mg total) under the tongue every 5 (five) minutes as needed.   100 tablet   0   . predniSONE (DELTASONE) 20 MG tablet      Take 3 daily for 2 days, then 2 daily for 2 days, then 1 daily for 2 days.   12 tablet   0   . simvastatin (ZOCOR) 40 MG tablet   Oral   Take 40 mg by mouth at bedtime.             BP 157/62  Pulse 72  Temp(Src) 97.4 F (36.3 C) (Oral)  Resp 24  SpO2 99%  Physical Exam  Nursing  note and vitals reviewed. Constitutional: She is oriented to person, place, and time. She appears well-developed and well-nourished. No distress.  There is a firm area to the upper anterior neck that is ttp.  There is also some erythema to the anterior lower neck.  I am unable to palpate any crepitus.    HENT:  Head: Normocephalic and atraumatic.  Neck: Normal range of motion. Neck supple.  Cardiovascular: Normal rate and regular rhythm.  Exam reveals no gallop and no friction rub.   No murmur heard. Pulmonary/Chest: Effort normal and breath sounds normal. No respiratory distress. She has no wheezes.  Abdominal: Soft. Bowel sounds are normal. She exhibits no distension. There is no tenderness.  Musculoskeletal: Normal range of motion.  Neurological: She is alert and oriented to person, place, and time.  Skin: Skin is warm and dry. She is not diaphoretic.    ED Course  Procedures (including critical care time)  Labs Reviewed - No data to display No results found.   No diagnosis found.    MDM  The patient presents with pain and swelling in the anterior neck.  The workup reveals an elevated wbc of 21k and ct that reveals soft tissue infection in the anterior cervical region.  The radiologist raised some concern for airway compromise.  Dr. Jearld Fenton from ENT was consulted and will see the patient in the ED.  After his evaluation, he has agreed to admit her to the hospital.  I have given vanc and zosyn after blood cultures to aggressively treat this apparent infection.        Geoffery Lyons, MD 09/22/12 2228

## 2012-09-22 NOTE — H&P (Signed)
Jasmine Fields is an 72 y.o. female.   Chief Complaint: Sore throat HPI: 72 year old who's here for evaluation of a sore throat. She has had this problem for over a week. She had a bad upper respiratory infection and was seen by her primary care physician. She was given penicillin and multiple cold remedies. She also was given a course of steroids through the week. She admits to not really taking the penicillin. She has not had a problem with any pharyngeal issues previously. She has not had any difficulty getting food or liquids down of significance. Friday 2 days ago she felt a little bit of short of breath. She's never had any stridor. Her voice has been hoarse for the entire week. She's had a very bad cough which is mostly nonproductive. She has noticed swelling in her neck over the last several days particularly down in the inferior aspect. It has decreased in size since yesterday.  Past Medical History  Diagnosis Date  . Hypothyroidism   . Aneurysm   . Hypertension   . Hyperlipidemia   . CHF (congestive heart failure)   . Low back pain   . Osteoarthritis   . IBS (irritable bowel syndrome)     With constipation  . Aneurysm of anterior cerebral artery   . Osteoporosis   . Takotsubo cardiomyopathy     Past Surgical History  Procedure Laterality Date  . Cerebral artery coil  2005  . Ectopic pregnancy surgery  1964    Right, SOO  . Cholecystectomy  1960  . Cardiac catheterization  06/2007    Normal arteries    Family History  Problem Relation Age of Onset  . Cervical cancer Mother   . Heart disease Father   . Diabetes Father   . Alzheimer's disease Brother   . Stroke Brother     x2  . Hypertension Brother    Social History:  reports that she quit smoking about 13 years ago. She does not have any smokeless tobacco history on file. She reports that she does not drink alcohol or use illicit drugs.  Allergies:  Allergies  Allergen Reactions  . Alendronate Sodium      Upset stomach   . Amlodipine     swelling  . Boniva (Ibandronate Sodium)     Upset stomach  . Latex Rash     (Not in a hospital admission)  Results for orders placed during the hospital encounter of 09/22/12 (from the past 48 hour(s))  CBC WITH DIFFERENTIAL     Status: Abnormal   Collection Time    09/22/12 10:45 AM      Result Value Range   WBC 21.5 (*) 4.0 - 10.5 K/uL   RBC 4.95  3.87 - 5.11 MIL/uL   Hemoglobin 14.9  12.0 - 15.0 g/dL   HCT 16.1  09.6 - 04.5 %   MCV 87.3  78.0 - 100.0 fL   MCH 30.1  26.0 - 34.0 pg   MCHC 34.5  30.0 - 36.0 g/dL   RDW 40.9  81.1 - 91.4 %   Platelets 441 (*) 150 - 400 K/uL   Neutrophils Relative 75  43 - 77 %   Neutro Abs 16.0 (*) 1.7 - 7.7 K/uL   Lymphocytes Relative 17  12 - 46 %   Lymphs Abs 3.7  0.7 - 4.0 K/uL   Monocytes Relative 7  3 - 12 %   Monocytes Absolute 1.5 (*) 0.1 - 1.0 K/uL   Eosinophils Relative 1  0 - 5 %   Eosinophils Absolute 0.2  0.0 - 0.7 K/uL   Basophils Relative 0  0 - 1 %   Basophils Absolute 0.1  0.0 - 0.1 K/uL  COMPREHENSIVE METABOLIC PANEL     Status: Abnormal   Collection Time    09/22/12 10:45 AM      Result Value Range   Sodium 141  135 - 145 mEq/L   Potassium 3.8  3.5 - 5.1 mEq/L   Chloride 104  96 - 112 mEq/L   CO2 25  19 - 32 mEq/L   Glucose, Bld 104 (*) 70 - 99 mg/dL   BUN 9  6 - 23 mg/dL   Creatinine, Ser 1.61  0.50 - 1.10 mg/dL   Calcium 9.3  8.4 - 09.6 mg/dL   Total Protein 7.2  6.0 - 8.3 g/dL   Albumin 3.4 (*) 3.5 - 5.2 g/dL   AST 15  0 - 37 U/L   ALT 15  0 - 35 U/L   Alkaline Phosphatase 88  39 - 117 U/L   Total Bilirubin 0.4  0.3 - 1.2 mg/dL   GFR calc non Af Amer 86 (*) >90 mL/min   GFR calc Af Amer >90  >90 mL/min   Comment:            The eGFR has been calculated     using the CKD EPI equation.     This calculation has not been     validated in all clinical     situations.     eGFR's persistently     <90 mL/min signify     possible Chronic Kidney Disease.   Ct Soft Tissue Neck  W Contrast  09/22/2012  *RADIOLOGY REPORT*  Clinical Data: Neck pain and swelling.  CT NECK WITH CONTRAST  Technique:  Multidetector CT imaging of the neck was performed with intravenous contrast.  Contrast: 75mL OMNIPAQUE IOHEXOL 300 MG/ML  SOLN  Comparison: 10/17/2011  Findings: Streak artifact from the basilar tip aneurysm coils noted, similar to prior head CT appearance.  Both submandibular glands appears swollen and heterogeneous. Parotid glands appear symmetric and within normal limits.  Mucosal thickening noted in the nasal cavity.  There is chronic left maxillary sinusitis.  The patient is edentulous.  Abnormal fluid density is present in the low supraglottic region just above the level of the cricoid and arytenoid cartilage, in the vicinity of the pharyngoesophageal junction, effacing the piriform sinuses, and less likely to be contained within the sinuses. The supraglottic airway is abnormally narrowed in this vicinity.  There is adjacent inflammatory stranding.  The aryepiglottic folds appear to be somewhat effaced, with borderline thickening of the base of the epiglottis.  Palatine tonsils and tonsillar pillars appear moderately prominent, and there is mild prominence of the lingual tonsils.  Low-level edema tracks deep to the platysma muscle and around the sternohyoid and sternothyroid right muscles.  No specific thyroid gland abnormality is observed.  Scattered small reactive lymph nodes are observed without overt pathologic adenopathy.  No significant abnormal upper mediastinal lesion.  Emphysema noted in the lung apices.  Loss of disc height and posterior osseous ridging leads to osseous foraminal stenosis at the C5-6 level, especially on the right side.  IMPRESSION:  1.  Low supraglottic inflammatory process with low density material effacing the piriform sinuses.  This could be from inflammation, early abscess formation, or tumor.  THE SUPRAGLOTTIC AIRWAY IN THIS VICINITY IS ABNORMALLY NARROW AND  COULD BE THREATENED. Laryngoscopy  to assess this region and a low clinical threshold for intubation is recommended. 2.  As part of this process, the aryepiglottic folds are effaced. There is some mild thickening of the base of the epiglottis although less epiglottic involvement than would typically be seen in epiglottitis. 3.  There is abnormal heterogeneous hyperenhancement and expansion of the submandibular glands without parotid involvement.  Given the bilaterality, viral sialoadenitis would be suspected.  There is adjacent abnormal stranding in the adipose tissue deep to the platysma muscle which tracks inferiorly in this region along the stylohyoid and stylet thyroid muscles. There is also subcutaneous edema along the lower neck. 4. Mild hypertrophy of the palatine tonsils and tonsillar pillars. Minimal if any lymphoid prominence at the base of the tongue. 5.  Emphysema. 6.  Posterior osseous ridging causing osseous foraminal stenosis at C5-6, right greater than left. 7.  Chronic left maxillary sinusitis.  I discussed these findings by telephone with Dr. Geoffery Lyons at 1:22 p.m. on 09/22/2012.   Original Report Authenticated By: Gaylyn Rong, M.D.     Review of Systems  Constitutional: Positive for malaise/fatigue.  HENT: Positive for sore throat.   Eyes: Negative.   Respiratory: Positive for shortness of breath.   Cardiovascular: Negative.   Gastrointestinal: Negative.   Musculoskeletal: Negative.   Skin: Negative.   Neurological: Positive for weakness.    Blood pressure 156/67, pulse 71, temperature 98 F (36.7 C), temperature source Oral, resp. rate 14, SpO2 93.00%. Physical Exam  Constitutional: She appears well-nourished.  HENT:  Nose: Nose normal.  Mouth/Throat: Oropharynx is clear and moist.  She has no evidence of any swelling in the oral cavity oropharynx by mouth exam. Fiberoptic exam-the nasopharynx looks clear. The piriform sinuses bilaterally are a bit occluded secondary  to some lateral pharyngeal swelling equal bilaterally area of the larynx looks actually normal except for slight thickening of both vocal cords. There is no lesions. The arytenoids have a normal contour and appearance. The epiglottis is normal. None of the findings on the CT scan suggesting base of epiglottis edema as well as arytenoid edema are evident on the exam. The swelling more is of the lateral posterior pharyngeal walls. There is no exudate or lesions. Neck both submandibular glands are large tender and firm. There are mobile. She has no other palpable mass. There is some erythema of the skin and the inferior aspect of the neck.  Eyes: Pupils are equal, round, and reactive to light.  Cardiovascular: Normal rate.   Respiratory: Effort normal.  GI: Soft.  Musculoskeletal: Normal range of motion.     Assessment/Plan Pharyngitis-it is unclear exactly what's causing this issue currently and the exam is not completely consistent with the CT scan findings of fluid collection around the arytenoids and supraglottis. She has no evidence of airway obstruction on fiberoptic exam. There does not appear to be any specific area for drainage currently. She is obviously having discomfort and has an increased white count suggestive of infection. She will start intravenous antibiotics. I think I will hold off on steroids currently and see how the antibiotics alone performed. She does have enlarged submandibular gland without evidence of abscess or fluid collection.  Suzanna Obey 09/22/2012, 2:35 PM

## 2012-09-23 ENCOUNTER — Encounter (HOSPITAL_COMMUNITY): Payer: Self-pay

## 2012-09-23 LAB — CBC WITH DIFFERENTIAL/PLATELET
Basophils Relative: 0 % (ref 0–1)
HCT: 39.6 % (ref 36.0–46.0)
Hemoglobin: 13.1 g/dL (ref 12.0–15.0)
Lymphs Abs: 2.8 10*3/uL (ref 0.7–4.0)
MCHC: 33.1 g/dL (ref 30.0–36.0)
Monocytes Absolute: 1.5 10*3/uL — ABNORMAL HIGH (ref 0.1–1.0)
Monocytes Relative: 8 % (ref 3–12)
Neutro Abs: 14.6 10*3/uL — ABNORMAL HIGH (ref 1.7–7.7)
Neutrophils Relative %: 76 % (ref 43–77)
RBC: 4.46 MIL/uL (ref 3.87–5.11)

## 2012-09-23 MED ORDER — VANCOMYCIN HCL 500 MG IV SOLR
500.0000 mg | Freq: Two times a day (BID) | INTRAVENOUS | Status: DC
  Administered 2012-09-23 (×2): 500 mg via INTRAVENOUS
  Filled 2012-09-23 (×4): qty 500

## 2012-09-23 NOTE — Progress Notes (Signed)
Subjective: She is still hurting in the submandibular area. She is taking po solids.   Objective: Vital signs in last 24 hours: Temp:  [97.4 F (36.3 C)-99.9 F (37.7 C)] 98.2 F (36.8 C) (04/28 0800) Pulse Rate:  [62-80] 63 (04/28 0800) Resp:  [14-25] 22 (04/28 0800) BP: (94-164)/(41-71) 118/48 mmHg (04/28 0800) SpO2:  [91 %-99 %] 94 % (04/28 0800) Weight:  [88.1 kg (194 lb 3.6 oz)-88.5 kg (195 lb 1.7 oz)] 88.5 kg (195 lb 1.7 oz) (04/28 0100) Last BM Date: 09/22/12  Intake/Output from previous day: 04/27 0701 - 04/28 0700 In: 2110 [P.O.:180; I.V.:1830; IV Piggyback:100] Out: 500 [Urine:500] Intake/Output this shift:    she is sitting up and comfortable. No stridor or breathing difficulty. the submandibular glands are still tender and large bilaterally. no other change in the neck.  l- clear cv- regular  Lab Results:   Recent Labs  09/22/12 1045  WBC 21.5*  HGB 14.9  HCT 43.2  PLT 441*   BMET  Recent Labs  09/22/12 1045  NA 141  K 3.8  CL 104  CO2 25  GLUCOSE 104*  BUN 9  CREATININE 0.69  CALCIUM 9.3   PT/INR No results found for this basename: LABPROT, INR,  in the last 72 hours ABG No results found for this basename: PHART, PCO2, PO2, HCO3,  in the last 72 hours  Studies/Results: Ct Soft Tissue Neck W Contrast  09/22/2012  *RADIOLOGY REPORT*  Clinical Data: Neck pain and swelling.  CT NECK WITH CONTRAST  Technique:  Multidetector CT imaging of the neck was performed with intravenous contrast.  Contrast: 75mL OMNIPAQUE IOHEXOL 300 MG/ML  SOLN  Comparison: 10/17/2011  Findings: Streak artifact from the basilar tip aneurysm coils noted, similar to prior head CT appearance.  Both submandibular glands appears swollen and heterogeneous. Parotid glands appear symmetric and within normal limits.  Mucosal thickening noted in the nasal cavity.  There is chronic left maxillary sinusitis.  The patient is edentulous.  Abnormal fluid density is present in the low  supraglottic region just above the level of the cricoid and arytenoid cartilage, in the vicinity of the pharyngoesophageal junction, effacing the piriform sinuses, and less likely to be contained within the sinuses. The supraglottic airway is abnormally narrowed in this vicinity.  There is adjacent inflammatory stranding.  The aryepiglottic folds appear to be somewhat effaced, with borderline thickening of the base of the epiglottis.  Palatine tonsils and tonsillar pillars appear moderately prominent, and there is mild prominence of the lingual tonsils.  Low-level edema tracks deep to the platysma muscle and around the sternohyoid and sternothyroid right muscles.  No specific thyroid gland abnormality is observed.  Scattered small reactive lymph nodes are observed without overt pathologic adenopathy.  No significant abnormal upper mediastinal lesion.  Emphysema noted in the lung apices.  Loss of disc height and posterior osseous ridging leads to osseous foraminal stenosis at the C5-6 level, especially on the right side.  IMPRESSION:  1.  Low supraglottic inflammatory process with low density material effacing the piriform sinuses.  This could be from inflammation, early abscess formation, or tumor.  THE SUPRAGLOTTIC AIRWAY IN THIS VICINITY IS ABNORMALLY NARROW AND COULD BE THREATENED. Laryngoscopy to assess this region and a low clinical threshold for intubation is recommended. 2.  As part of this process, the aryepiglottic folds are effaced. There is some mild thickening of the base of the epiglottis although less epiglottic involvement than would typically be seen in epiglottitis. 3.  There  is abnormal heterogeneous hyperenhancement and expansion of the submandibular glands without parotid involvement.  Given the bilaterality, viral sialoadenitis would be suspected.  There is adjacent abnormal stranding in the adipose tissue deep to the platysma muscle which tracks inferiorly in this region along the stylohyoid  and stylet thyroid muscles. There is also subcutaneous edema along the lower neck. 4. Mild hypertrophy of the palatine tonsils and tonsillar pillars. Minimal if any lymphoid prominence at the base of the tongue. 5.  Emphysema. 6.  Posterior osseous ridging causing osseous foraminal stenosis at C5-6, right greater than left. 7.  Chronic left maxillary sinusitis.  I discussed these findings by telephone with Dr. Geoffery Lyons at 1:22 p.m. on 09/22/2012.   Original Report Authenticated By: Gaylyn Rong, M.D.     Anti-infectives: Anti-infectives   Start     Dose/Rate Route Frequency Ordered Stop   09/22/12 1500  piperacillin-tazobactam (ZOSYN) IVPB 3.375 g     3.375 g 12.5 mL/hr over 240 Minutes Intravenous 3 times per day 09/22/12 1454     09/22/12 1130  piperacillin-tazobactam (ZOSYN) IVPB 3.375 g     3.375 g 12.5 mL/hr over 240 Minutes Intravenous  Once 09/22/12 1120 09/22/12 1506   09/22/12 1130  vancomycin (VANCOCIN) IVPB 1000 mg/200 mL premix  Status:  Discontinued     1,000 mg 200 mL/hr over 60 Minutes Intravenous  Once 09/22/12 1120 09/22/12 1454      Assessment/Plan: s/p * No surgery found * will advance diet. add vancomycin. continue care as she is not any better today  LOS: 1 day    Suzanna Obey 09/23/2012

## 2012-09-24 MED ORDER — VANCOMYCIN HCL 10 G IV SOLR
1500.0000 mg | INTRAVENOUS | Status: DC
  Administered 2012-09-24 – 2012-09-25 (×2): 1500 mg via INTRAVENOUS
  Filled 2012-09-24 (×3): qty 1500

## 2012-09-24 NOTE — Progress Notes (Signed)
ANTIBIOTIC CONSULT NOTE - INITIAL  Pharmacy Consult for vancomycin Indication: neck infection  Allergies  Allergen Reactions  . Alendronate Sodium     Upset stomach   . Amlodipine     swelling  . Boniva (Ibandronate Sodium)     Upset stomach  . Latex Rash    Patient Measurements: Height: 5\' 5"  (165.1 cm) Weight: 199 lb 1.2 oz (90.3 kg) IBW/kg (Calculated) : 57   Vital Signs: Temp: 97.9 F (36.6 C) (04/29 0751) Temp src: Oral (04/29 0751) BP: 118/56 mmHg (04/29 0751) Pulse Rate: 66 (04/29 0751) Intake/Output from previous day: 04/28 0701 - 04/29 0700 In: 2380 [I.V.:2030; IV Piggyback:350] Out: 2100 [Urine:2100] Intake/Output from this shift:    Labs:  Recent Labs  09/22/12 1045 09/23/12 1534  WBC 21.5* 19.2*  HGB 14.9 13.1  PLT 441* 367  CREATININE 0.69  --    Estimated Creatinine Clearance: 72.6 ml/min (by C-G formula based on Cr of 0.69). No results found for this basename: VANCOTROUGH, Leodis Binet, VANCORANDOM, GENTTROUGH, GENTPEAK, GENTRANDOM, TOBRATROUGH, TOBRAPEAK, TOBRARND, AMIKACINPEAK, AMIKACINTROU, AMIKACIN,  in the last 72 hours   Microbiology: Recent Results (from the past 720 hour(s))  CULTURE, BLOOD (ROUTINE X 2)     Status: None   Collection Time    09/22/12  1:08 PM      Result Value Range Status   Specimen Description BLOOD RIGHT ARM   Final   Special Requests BOTTLES DRAWN AEROBIC ONLY 10CC   Final   Culture  Setup Time 09/22/2012 21:21   Final   Culture     Final   Value:        BLOOD CULTURE RECEIVED NO GROWTH TO DATE CULTURE WILL BE HELD FOR 5 DAYS BEFORE ISSUING A FINAL NEGATIVE REPORT   Report Status PENDING   Incomplete  CULTURE, BLOOD (ROUTINE X 2)     Status: None   Collection Time    09/22/12  1:17 PM      Result Value Range Status   Specimen Description BLOOD RIGHT HAND   Final   Special Requests BOTTLES DRAWN AEROBIC ONLY 10CC   Final   Culture  Setup Time 09/22/2012 21:21   Final   Culture     Final   Value:        BLOOD  CULTURE RECEIVED NO GROWTH TO DATE CULTURE WILL BE HELD FOR 5 DAYS BEFORE ISSUING A FINAL NEGATIVE REPORT   Report Status PENDING   Incomplete  MRSA PCR SCREENING     Status: None   Collection Time    09/22/12  5:37 PM      Result Value Range Status   MRSA by PCR NEGATIVE  NEGATIVE Final   Comment:            The GeneXpert MRSA Assay (FDA     approved for NASAL specimens     only), is one component of a     comprehensive MRSA colonization     surveillance program. It is not     intended to diagnose MRSA     infection nor to guide or     monitor treatment for     MRSA infections.    Medical History: Past Medical History  Diagnosis Date  . Hypothyroidism   . Aneurysm   . Hypertension   . Hyperlipidemia   . CHF (congestive heart failure)   . Low back pain   . Osteoarthritis   . IBS (irritable bowel syndrome)     With constipation  .  Aneurysm of anterior cerebral artery   . Osteoporosis   . Takotsubo cardiomyopathy     Medications:  Prescriptions prior to admission  Medication Sig Dispense Refill  . amitriptyline (ELAVIL) 10 MG tablet Take 10 mg by mouth at bedtime.        Marland Kitchen aspirin 81 MG tablet Take 81 mg by mouth daily.        Marland Kitchen azithromycin (ZITHROMAX) 250 MG tablet ADay 1 : Take 2. Days 2-5: Take 1 daily  6 tablet  0  . benzonatate (TESSALON) 200 MG capsule Take 1 capsule (200 mg total) by mouth 2 (two) times daily as needed for cough.  20 capsule  0  . bumetanide (BUMEX) 1 MG tablet Take 1 mg by mouth daily. 1/2 pill a day       . carvedilol (COREG) 6.25 MG tablet Take 6.25 mg by mouth 2 (two) times daily with a meal.        . levothyroxine (SYNTHROID, LEVOTHROID) 100 MCG tablet Take 100 mcg by mouth daily.        Marland Kitchen lisinopril (PRINIVIL,ZESTRIL) 40 MG tablet Take 40 mg by mouth daily.        . predniSONE (DELTASONE) 20 MG tablet Take 3 daily for 2 days, then 2 daily for 2 days, then 1 daily for 2 days.       Assessment: 72 year old woman admitted for pharyngitis.   Has been on Zosyn and clindamycin and has not responded adequately.  Vancomycin 500mg  IV q12 started yesterday by MD.  Pharmacy to manage vancomycin starting today  Goal of Therapy:  Vancomycin trough level 10-15 mcg/ml  Plan:  Change vancomycin to 1.5g IV q24.  Follow renal function, clinical status and culture results.  Will check vancomycin trough if therapy to continue for at least 4 more days.  Mickeal Skinner 09/24/2012,8:34 AM

## 2012-09-24 NOTE — Progress Notes (Signed)
Subjective: She is better and still has some discomfort. Glands still large.  Objective: Vital signs in last 24 hours: Temp:  [97.9 F (36.6 C)-99.2 F (37.3 C)] 97.9 F (36.6 C) (04/29 0751) Pulse Rate:  [57-69] 66 (04/29 0751) Resp:  [17-24] 19 (04/29 0751) BP: (94-118)/(45-63) 118/56 mmHg (04/29 0751) SpO2:  [92 %-100 %] 94 % (04/29 0751) Weight:  [90.3 kg (199 lb 1.2 oz)] 90.3 kg (199 lb 1.2 oz) (04/29 0023) Last BM Date: 09/23/12  Intake/Output from previous day: 04/28 0701 - 04/29 0700 In: 2380 [I.V.:2030; IV Piggyback:350] Out: 2100 [Urine:2100] Intake/Output this shift:    she is more comfortable. neck- decrease in erythema of skin. both submandibular glands still very large equal and less tender. no mouth lesions.   Lab Results:   Recent Labs  09/22/12 1045 09/23/12 1534  WBC 21.5* 19.2*  HGB 14.9 13.1  HCT 43.2 39.6  PLT 441* 367   BMET  Recent Labs  09/22/12 1045  NA 141  K 3.8  CL 104  CO2 25  GLUCOSE 104*  BUN 9  CREATININE 0.69  CALCIUM 9.3   PT/INR No results found for this basename: LABPROT, INR,  in the last 72 hours ABG No results found for this basename: PHART, PCO2, PO2, HCO3,  in the last 72 hours  Studies/Results: Ct Soft Tissue Neck W Contrast  09/22/2012  *RADIOLOGY REPORT*  Clinical Data: Neck pain and swelling.  CT NECK WITH CONTRAST  Technique:  Multidetector CT imaging of the neck was performed with intravenous contrast.  Contrast: 75mL OMNIPAQUE IOHEXOL 300 MG/ML  SOLN  Comparison: 10/17/2011  Findings: Streak artifact from the basilar tip aneurysm coils noted, similar to prior head CT appearance.  Both submandibular glands appears swollen and heterogeneous. Parotid glands appear symmetric and within normal limits.  Mucosal thickening noted in the nasal cavity.  There is chronic left maxillary sinusitis.  The patient is edentulous.  Abnormal fluid density is present in the low supraglottic region just above the level of the  cricoid and arytenoid cartilage, in the vicinity of the pharyngoesophageal junction, effacing the piriform sinuses, and less likely to be contained within the sinuses. The supraglottic airway is abnormally narrowed in this vicinity.  There is adjacent inflammatory stranding.  The aryepiglottic folds appear to be somewhat effaced, with borderline thickening of the base of the epiglottis.  Palatine tonsils and tonsillar pillars appear moderately prominent, and there is mild prominence of the lingual tonsils.  Low-level edema tracks deep to the platysma muscle and around the sternohyoid and sternothyroid right muscles.  No specific thyroid gland abnormality is observed.  Scattered small reactive lymph nodes are observed without overt pathologic adenopathy.  No significant abnormal upper mediastinal lesion.  Emphysema noted in the lung apices.  Loss of disc height and posterior osseous ridging leads to osseous foraminal stenosis at the C5-6 level, especially on the right side.  IMPRESSION:  1.  Low supraglottic inflammatory process with low density material effacing the piriform sinuses.  This could be from inflammation, early abscess formation, or tumor.  THE SUPRAGLOTTIC AIRWAY IN THIS VICINITY IS ABNORMALLY NARROW AND COULD BE THREATENED. Laryngoscopy to assess this region and a low clinical threshold for intubation is recommended. 2.  As part of this process, the aryepiglottic folds are effaced. There is some mild thickening of the base of the epiglottis although less epiglottic involvement than would typically be seen in epiglottitis. 3.  There is abnormal heterogeneous hyperenhancement and expansion of the submandibular glands  without parotid involvement.  Given the bilaterality, viral sialoadenitis would be suspected.  There is adjacent abnormal stranding in the adipose tissue deep to the platysma muscle which tracks inferiorly in this region along the stylohyoid and stylet thyroid muscles. There is also  subcutaneous edema along the lower neck. 4. Mild hypertrophy of the palatine tonsils and tonsillar pillars. Minimal if any lymphoid prominence at the base of the tongue. 5.  Emphysema. 6.  Posterior osseous ridging causing osseous foraminal stenosis at C5-6, right greater than left. 7.  Chronic left maxillary sinusitis.  I discussed these findings by telephone with Dr. Geoffery Lyons at 1:22 p.m. on 09/22/2012.   Original Report Authenticated By: Gaylyn Rong, M.D.     Anti-infectives: Anti-infectives   Start     Dose/Rate Route Frequency Ordered Stop   09/23/12 0900  vancomycin (VANCOCIN) 500 mg in sodium chloride 0.9 % 100 mL IVPB     500 mg 100 mL/hr over 60 Minutes Intravenous Every 12 hours 09/23/12 0852     09/22/12 1500  piperacillin-tazobactam (ZOSYN) IVPB 3.375 g     3.375 g 12.5 mL/hr over 240 Minutes Intravenous 3 times per day 09/22/12 1454     09/22/12 1130  piperacillin-tazobactam (ZOSYN) IVPB 3.375 g     3.375 g 12.5 mL/hr over 240 Minutes Intravenous  Once 09/22/12 1120 09/22/12 1506   09/22/12 1130  vancomycin (VANCOCIN) IVPB 1000 mg/200 mL premix  Status:  Discontinued     1,000 mg 200 mL/hr over 60 Minutes Intravenous  Once 09/22/12 1120 09/22/12 1454      Assessment/Plan: s/p * No surgery found * she is better but still with significant swelling of the salivary glands. we discussed the possibility of FNA. she can be transfered to the floor. pharmacy to manage Vancomycin  LOS: 2 days    Suzanna Obey 09/24/2012

## 2012-09-24 NOTE — Progress Notes (Signed)
Jasmine Fields [id number]  [name] is a 72 y.o. female patient admitted from ED awake, alert  & orientated  X 3, no distress noted.  VSS - Blood pressure 92/49, pulse 58, temperature 98.1 F (36.7 C), temperature source Oral, resp. rate 18, height 5\' 5"  (1.651 m), weight 90.3 kg (199 lb 1.2 oz), SpO2 97.00%.  no c/o shortness of breath, no c/o chest pain. IV in place with a transparent dsg that's clean dry and intact without redness   Will cont to eval and treat per MD orders.  Bing Quarry, RN 09/24/2012 3:27 PM

## 2012-09-25 MED ORDER — DOXYCYCLINE HYCLATE 100 MG PO TABS
100.0000 mg | ORAL_TABLET | Freq: Two times a day (BID) | ORAL | Status: DC
  Administered 2012-09-25 – 2012-09-26 (×3): 100 mg via ORAL
  Filled 2012-09-25 (×4): qty 1

## 2012-09-25 NOTE — Progress Notes (Signed)
  Subjective: She feels much better. It still feels like she has some food items slightly stick when swallowing but overall she has no pain with swallowing like she did. She has far less erythema of the neck and the submandibular glands are less.  Objective: Vital signs in last 24 hours: Temp:  [97.6 F (36.4 C)-99 F (37.2 C)] 99 F (37.2 C) (04/30 0900) Pulse Rate:  [53-63] 57 (04/30 0900) Resp:  [13-20] 20 (04/30 0900) BP: (92-144)/(46-89) 130/70 mmHg (04/30 0958) SpO2:  [94 %-99 %] 94 % (04/30 0900) Last BM Date: 09/24/12  Intake/Output from previous day: 04/29 0701 - 04/30 0700 In: 1693.3 [P.O.:340; I.V.:1253.3; IV Piggyback:100] Out: 2150 [Urine:2150] Intake/Output this shift: Total I/O In: 240 [P.O.:240] Out: -   she looks more comfortable and energetic. her swelling is less and erythema almost resolved. the sunmand glands are less swollen. no other masses. l- clear cv- regular.  Lab Results:   Recent Labs  09/23/12 1534  WBC 19.2*  HGB 13.1  HCT 39.6  PLT 367   BMET No results found for this basename: NA, K, CL, CO2, GLUCOSE, BUN, CREATININE, CALCIUM,  in the last 72 hours PT/INR No results found for this basename: LABPROT, INR,  in the last 72 hours ABG No results found for this basename: PHART, PCO2, PO2, HCO3,  in the last 72 hours  Studies/Results: No results found.  Anti-infectives: Anti-infectives   Start     Dose/Rate Route Frequency Ordered Stop   09/24/12 1000  vancomycin (VANCOCIN) 1,500 mg in sodium chloride 0.9 % 500 mL IVPB     1,500 mg 250 mL/hr over 120 Minutes Intravenous Every 24 hours 09/24/12 0834     09/23/12 0900  vancomycin (VANCOCIN) 500 mg in sodium chloride 0.9 % 100 mL IVPB  Status:  Discontinued     500 mg 100 mL/hr over 60 Minutes Intravenous Every 12 hours 09/23/12 0852 09/24/12 0833   09/22/12 1500  piperacillin-tazobactam (ZOSYN) IVPB 3.375 g     3.375 g 12.5 mL/hr over 240 Minutes Intravenous 3 times per day 09/22/12  1454     09/22/12 1130  piperacillin-tazobactam (ZOSYN) IVPB 3.375 g     3.375 g 12.5 mL/hr over 240 Minutes Intravenous  Once 09/22/12 1120 09/22/12 1506   09/22/12 1130  vancomycin (VANCOCIN) IVPB 1000 mg/200 mL premix  Status:  Discontinued     1,000 mg 200 mL/hr over 60 Minutes Intravenous  Once 09/22/12 1120 09/22/12 1454      Assessment/Plan: s/p * No surgery found * i talked to her about options and she prefers to change to po abx but stay another day to make sure she is not going to  recur. I will change to doxycycline and havie heating pad to the glands.   LOS: 3 days    Suzanna Obey 09/25/2012

## 2012-09-26 MED ORDER — DOXYCYCLINE HYCLATE 100 MG PO TABS: 100.0000 mg | ORAL_TABLET | Freq: Two times a day (BID) | ORAL | Status: DC

## 2012-09-26 NOTE — Progress Notes (Signed)
  Subjective: She is doing much better on 24 hours of oral antibiotics. She is ready to the discharge. She has no further sore throat. Her submandibular glands are still swollen bilaterally but it seemed to be slightly better.  Objective: Vital signs in last 24 hours: Temp:  [98.1 F (36.7 C)-99 F (37.2 C)] 98.1 F (36.7 C) (05/01 0520) Pulse Rate:  [57-82] 62 (05/01 0520) Resp:  [18-20] 18 (05/01 0520) BP: (107-134)/(59-75) 107/59 mmHg (05/01 0520) SpO2:  [90 %-99 %] 90 % (05/01 0520) Last BM Date: 09/25/12  Intake/Output from previous day: 04/30 0701 - 05/01 0700 In: 1371.7 [P.O.:240; I.V.:1131.7] Out: -  Intake/Output this shift:    awake and alert. Her voice is normal.  Neck is without swelling except for her bilaterally equal submandibular glands which do feel like they're decreased in size. The erythema of the skin has completely resolved. Lungs are clear. Heart regular. No swelling tenderness of extremities  Lab Results:   Recent Labs  09/23/12 1534  WBC 19.2*  HGB 13.1  HCT 39.6  PLT 367   BMET No results found for this basename: NA, K, CL, CO2, GLUCOSE, BUN, CREATININE, CALCIUM,  in the last 72 hours PT/INR No results found for this basename: LABPROT, INR,  in the last 72 hours ABG No results found for this basename: PHART, PCO2, PO2, HCO3,  in the last 72 hours  Studies/Results: No results found.  Anti-infectives: Anti-infectives   Start     Dose/Rate Route Frequency Ordered Stop   09/25/12 1400  doxycycline (VIBRA-TABS) tablet 100 mg     100 mg Oral Every 12 hours 09/25/12 1147     09/24/12 1000  vancomycin (VANCOCIN) 1,500 mg in sodium chloride 0.9 % 500 mL IVPB  Status:  Discontinued     1,500 mg 250 mL/hr over 120 Minutes Intravenous Every 24 hours 09/24/12 0834 09/25/12 1147   09/23/12 0900  vancomycin (VANCOCIN) 500 mg in sodium chloride 0.9 % 100 mL IVPB  Status:  Discontinued     500 mg 100 mL/hr over 60 Minutes Intravenous Every 12 hours  09/23/12 0852 09/24/12 0833   09/22/12 1500  piperacillin-tazobactam (ZOSYN) IVPB 3.375 g  Status:  Discontinued     3.375 g 12.5 mL/hr over 240 Minutes Intravenous 3 times per day 09/22/12 1454 09/25/12 1147   09/22/12 1130  piperacillin-tazobactam (ZOSYN) IVPB 3.375 g     3.375 g 12.5 mL/hr over 240 Minutes Intravenous  Once 09/22/12 1120 09/22/12 1506   09/22/12 1130  vancomycin (VANCOCIN) IVPB 1000 mg/200 mL premix  Status:  Discontinued     1,000 mg 200 mL/hr over 60 Minutes Intravenous  Once 09/22/12 1120 09/22/12 1454      Assessment/Plan: s/p * No surgery found * Dischargethat she seems to be much better and has maintained her improvement on by mouth antibiotics. She will be discharged on doxycycline and followup in the office in one week.  LOS: 4 days    Jasmine Fields 09/26/2012

## 2012-09-26 NOTE — Care Management Note (Signed)
    Page 1 of 1   09/26/2012     3:22:39 PM   CARE MANAGEMENT NOTE 09/26/2012  Patient:  Jasmine Fields, Jasmine Fields   Account Number:  [id number]  Date Initiated:  09/26/2012  Documentation initiated by:  Letha Cape  Subjective/Objective Assessment:   dx pharyngitis  admit- lives with spouse. pta indep.     Action/Plan:   Anticipated DC Date:  09/26/2012   Anticipated DC Plan:  HOME/SELF CARE      DC Planning Services  CM consult      Choice offered to / List presented to:             Status of service:  Completed, signed off Medicare Important Message given?   (If response is "NO", the following Medicare IM given date fields will be blank) Date Medicare IM given:   Date Additional Medicare IM given:    Discharge Disposition:  HOME/SELF CARE  Per UR Regulation:  Reviewed for med. necessity/level of care/duration of stay  If discussed at Long Length of Stay Meetings, dates discussed:    Comments:  09/26/12 15:21 Letha Cape RN, BSN [phone] patient lives with spouse, pta indep.  Patient for dc today, no CM referral, no needs anticipated.

## 2012-09-26 NOTE — Progress Notes (Signed)
Pt. discharged to floor,verbalized understanding of discharged instruction,medication,restriction,diet and follow up appointment.Baseline Vitals sign stable,Pt comfortable,no sign and symptom of distress. 

## 2012-09-26 NOTE — Discharge Summary (Signed)
Physician Discharge Summary  Patient ID: Jasmine Fields MRN: [id number] DOB/AGE: [birth year] 72 y.o.  Admit date: 09/22/2012 Discharge date: 09/26/2012  Admission Diagnoses:pharyngitis/submandibular sialadenitis  Discharge Diagnoses: same Active Problems:   * No active hospital problems. *   Discharged Condition: good  Hospital Course: patient was admitted for severe sore throat and swelling in her airway. She developed this after no upper respiratory infection. She had significant swelling in her neck as well with both submandibular gland enlarged. There was fluid that seemed to be located in the arytenoid region bilaterally. She was admitted and placed in the intensive care unit initially. She did improve on intravenous antibiotics but seem to do better after vancomycin was added. She was transferred to the floor. She continued to improve with improvement in swallowing and the soreness resolved. She had no airway issues during admission and had no stridor. Her voice improved to the point of normal voice. She was taking fluids and solids well. She was discharged to home on doxycycline  Consults: None  Significant Diagnostic Studies: none  Treatments: antibiotics: Zosyn  Discharge Exam: Blood pressure 107/59, pulse 62, temperature 98.1 F (36.7 C), temperature source Oral, resp. rate 18, height 5\' 5"  (1.651 m), weight 90.3 kg (199 lb 1.2 oz), SpO2 90.00%. awake and alert, nose is clear. Oral cavity/oropharynx there is  no lesions. Neck bilateral submandibular gland swelling which is decreased. No erythema the neck scan and no other masses or swelling. Lungs clear. CV-regular extremities no swelling or pain. Abdomen soft.  Disposition: she was discharged to home on doxycycline to followup in one week. Call if she has any worsening of her condition.     Medication List    ASK your doctor about these medications       amitriptyline 10 MG tablet  Commonly known as:  ELAVIL  Take 10  mg by mouth at bedtime.     aspirin 81 MG tablet  Take 81 mg by mouth daily.     azithromycin 250 MG tablet  Commonly known as:  ZITHROMAX  ADay 1 : Take 2.  Days 2-5: Take 1 daily     benzonatate 200 MG capsule  Commonly known as:  TESSALON  Take 1 capsule (200 mg total) by mouth 2 (two) times daily as needed for cough.     bumetanide 1 MG tablet  Commonly known as:  BUMEX  Take 1 mg by mouth daily. 1/2 pill a day     carvedilol 6.25 MG tablet  Commonly known as:  COREG  Take 6.25 mg by mouth 2 (two) times daily with a meal.     levothyroxine 100 MCG tablet  Commonly known as:  SYNTHROID, LEVOTHROID  Take 100 mcg by mouth daily.     lisinopril 40 MG tablet  Commonly known as:  PRINIVIL,ZESTRIL  Take 40 mg by mouth daily.     predniSONE 20 MG tablet  Commonly known as:  DELTASONE  Take 3 daily for 2 days, then 2 daily for 2 days, then 1 daily for 2 days.         SignedSuzanna Obey 09/26/2012, 8:14 AM

## 2012-09-28 LAB — CULTURE, BLOOD (ROUTINE X 2): Culture: NO GROWTH

## 2012-10-29 ENCOUNTER — Other Ambulatory Visit (INDEPENDENT_AMBULATORY_CARE_PROVIDER_SITE_OTHER): Payer: 59

## 2012-10-29 DIAGNOSIS — I10 Essential (primary) hypertension: Secondary | ICD-10-CM

## 2012-10-29 DIAGNOSIS — E785 Hyperlipidemia, unspecified: Secondary | ICD-10-CM

## 2012-10-29 DIAGNOSIS — M8580 Other specified disorders of bone density and structure, unspecified site: Secondary | ICD-10-CM

## 2012-10-29 DIAGNOSIS — Z79899 Other long term (current) drug therapy: Secondary | ICD-10-CM

## 2012-10-29 LAB — CBC WITH DIFFERENTIAL/PLATELET
Basophils Absolute: 0.1 10*3/uL (ref 0.0–0.1)
Basophils Relative: 1 % (ref 0–1)
HCT: 42.3 % (ref 36.0–46.0)
Lymphocytes Relative: 46 % (ref 12–46)
MCHC: 33.3 g/dL (ref 30.0–36.0)
Monocytes Absolute: 0.5 10*3/uL (ref 0.1–1.0)
Neutro Abs: 2.7 10*3/uL (ref 1.7–7.7)
Neutrophils Relative %: 41 % — ABNORMAL LOW (ref 43–77)
Platelets: 352 10*3/uL (ref 150–400)
RDW: 15.5 % (ref 11.5–15.5)
WBC: 6.6 10*3/uL (ref 4.0–10.5)

## 2012-10-29 LAB — LIPID PANEL
HDL: 36 mg/dL — ABNORMAL LOW (ref >39)
LDL Cholesterol: 156 mg/dL — ABNORMAL HIGH (ref 0–99)
Total CHOL/HDL Ratio: 6.7 Ratio
Triglycerides: 248 mg/dL — ABNORMAL HIGH (ref <150)
VLDL: 50 mg/dL — ABNORMAL HIGH (ref 0–40)

## 2012-10-29 LAB — COMPLETE METABOLIC PANEL WITH GFR
ALT: 8 U/L (ref 0–35)
AST: 11 U/L (ref 0–37)
Albumin: 3.8 g/dL (ref 3.5–5.2)
Alkaline Phosphatase: 77 U/L (ref 39–117)
BUN: 13 mg/dL (ref 6–23)
Calcium: 9.5 mg/dL (ref 8.4–10.5)
Chloride: 103 mEq/L (ref 96–112)
Potassium: 5.5 mEq/L — ABNORMAL HIGH (ref 3.5–5.3)
Sodium: 140 mEq/L (ref 135–145)

## 2012-11-04 ENCOUNTER — Ambulatory Visit: Payer: 59 | Admitting: Family Medicine

## 2012-11-04 ENCOUNTER — Ambulatory Visit (INDEPENDENT_AMBULATORY_CARE_PROVIDER_SITE_OTHER): Payer: 59 | Admitting: Family Medicine

## 2012-11-04 ENCOUNTER — Ambulatory Visit
Admission: RE | Admit: 2012-11-04 | Discharge: 2012-11-04 | Disposition: A | Discharge status: Home / Self Care | Payer: Medicare PPO | Source: Ambulatory Visit | Attending: Family Medicine | Admitting: Family Medicine

## 2012-11-04 ENCOUNTER — Encounter: Payer: Self-pay | Admitting: Family Medicine

## 2012-11-04 VITALS — BP 126/72 | HR 98 | Temp 99.4°F | Resp 22 | Wt 195.0 lb

## 2012-11-04 DIAGNOSIS — J45901 Unspecified asthma with (acute) exacerbation: Secondary | ICD-10-CM

## 2012-11-04 DIAGNOSIS — J45909 Unspecified asthma, uncomplicated: Secondary | ICD-10-CM

## 2012-11-04 MED ORDER — ALBUTEROL SULFATE (5 MG/ML) 0.5% IN NEBU
2.5000 mg | INHALATION_SOLUTION | Freq: Once | RESPIRATORY_TRACT | Status: AC
  Administered 2012-11-04: 2.5 mg via RESPIRATORY_TRACT

## 2012-11-04 MED ORDER — METHYLPREDNISOLONE ACETATE 40 MG/ML IJ SUSP
60.0000 mg | Freq: Once | INTRAMUSCULAR | Status: AC
  Administered 2012-11-04: 60 mg via INTRAMUSCULAR

## 2012-11-04 MED ORDER — IPRATROPIUM BROMIDE 0.02 % IN SOLN
0.2500 mg | Freq: Once | RESPIRATORY_TRACT | Status: AC
  Administered 2012-11-04: 0.26 mg via RESPIRATORY_TRACT

## 2012-11-04 MED ORDER — METHYLPREDNISOLONE ACETATE 40 MG/ML IJ SUSP: 40.0000 mg | Freq: Once | INTRAMUSCULAR | Status: AC

## 2012-11-04 MED ORDER — PREDNISONE 20 MG PO TABS: ORAL_TABLET | ORAL | Status: DC

## 2012-11-04 MED ORDER — ALBUTEROL SULFATE HFA 108 (90 BASE) MCG/ACT IN AERS: 2.0000 | INHALATION_SPRAY | Freq: Four times a day (QID) | RESPIRATORY_TRACT | Status: AC | PRN

## 2012-11-04 MED ORDER — ALBUTEROL SULFATE (2.5 MG/3ML) 0.083% IN NEBU
2.5000 mg | INHALATION_SOLUTION | Freq: Once | RESPIRATORY_TRACT | Status: AC
  Administered 2012-11-04: 2.5 mg via RESPIRATORY_TRACT

## 2012-11-04 NOTE — Progress Notes (Signed)
Subjective:    Patient ID: Jasmine Fields, female    DOB: 03/30/1941, 72 y.o.   MRN: [id number]  HPI At the end of April for epiglottitis. She was treated with high-dose IV antibiotics and outpatient doxycycline. She experienced a complete remission of her symptoms. 5 days ago she developed sudden onset of shortness of breath and wheezing and coughing after mowing her yard. Today in clinic she is coughing constantly, wheezing, with slight accessory muscle usage.  She denies any fevers or chills. She is also here today to discuss her lab work that was drawn previously however the urgency of her respiratory complaints has taken priority.   Past Medical History  Diagnosis Date  . Hypothyroidism   . Aneurysm   . Hypertension   . Hyperlipidemia   . CHF (congestive heart failure)   . Low back pain   . Osteoarthritis   . IBS (irritable bowel syndrome)     With constipation  . Aneurysm of anterior cerebral artery   . Osteoporosis   . Takotsubo cardiomyopathy    Current Outpatient Prescriptions on File Prior to Visit  Medication Sig Dispense Refill  . amitriptyline (ELAVIL) 10 MG tablet Take 10 mg by mouth at bedtime.        Marland Kitchen aspirin 81 MG tablet Take 81 mg by mouth daily.        . bumetanide (BUMEX) 1 MG tablet Take 1 mg by mouth daily. 1/2 pill a day       . carvedilol (COREG) 6.25 MG tablet Take 6.25 mg by mouth 2 (two) times daily with a meal.        . levothyroxine (SYNTHROID, LEVOTHROID) 100 MCG tablet Take 100 mcg by mouth daily.        Marland Kitchen lisinopril (PRINIVIL,ZESTRIL) 40 MG tablet Take 40 mg by mouth daily.         No current facility-administered medications on file prior to visit.   Allergies  Allergen Reactions  . Alendronate Sodium     Upset stomach   . Amlodipine     swelling  . Boniva (Ibandronate Sodium)     Upset stomach  . Latex Rash   History   Social History  . Marital Status: Married    Spouse Name: N/A    Number of Children: N/A  . Years of Education:  N/A   Occupational History  . Not on file.   Social History Main Topics  . Smoking status: Former Smoker -- 1.50 packs/day for 30 years    Quit date: 05/30/1999  . Smokeless tobacco: Not on file  . Alcohol Use: No  . Drug Use: No  . Sexually Active: Not on file   Other Topics Concern  . Not on file   Social History Narrative   Married and lives with spouse      Review of Systems  All other systems reviewed and are negative.       Objective:   Physical Exam  Vitals reviewed. Constitutional: She appears well-developed and well-nourished.  Cardiovascular: Normal rate and normal heart sounds.   No murmur heard. Pulmonary/Chest: Accessory muscle usage present. Not tachypneic. No respiratory distress. She has decreased breath sounds. She has wheezes in the right upper field, the right lower field, the left upper field and the left lower field. She has rhonchi in the right lower field and the left lower field. She has no rales.  Abdominal: Soft. Bowel sounds are normal.  Assessment & Plan:  1. Asthma attack Patient symptomatically sounds like an asthma exacerbation. She was given 60 mg of Depo-Medrol IM x1. She was given an initial albuterol 2.5 mg neb x1. This was administered at 3:30. She did not experience improvement by 3:45.  At 4:00 combination neb of 3 mg of albuterol and 0.5 mg of Atrovent was administered.  The patient began to feel better. At that time I recommended she do get chest x-ray to route a right lower lobe pneumonia as well as were appreciated in the right lower lobe. Meanwhile I would like her to start prednisone 20 mg tablets. She states 3 tablets on days 1 and 2, 2 tablets on days 3 and 4, 1 tablet on day 5 and 6.  She can use Ventolin 2 puffs inhaled every 6 hours when necessary wheezing. If the chest x-ray shows an infection I will call antibiotics as well. - methylPREDNISolone acetate (DEPO-MEDROL) injection 60 mg; Inject 1.5 mLs (60 mg total)  into the muscle once. - albuterol (PROVENTIL) (2.5 MG/3ML) 0.083% nebulizer solution 2.5 mg; Take 3 mLs (2.5 mg total) by nebulization once.

## 2012-11-05 ENCOUNTER — Telehealth: Payer: Self-pay | Admitting: Family Medicine

## 2012-11-05 MED ORDER — AZITHROMYCIN 250 MG PO TABS: ORAL_TABLET | ORAL | Status: DC

## 2012-11-05 NOTE — Telephone Encounter (Signed)
..  Patient aware per vm however- pt was running low grade fever yesterday in office so as per Dr. Tanya Nones will cover with Z-pack.

## 2012-11-05 NOTE — Telephone Encounter (Signed)
No, the CXR was clear.  There is no evidence of an infection that needs abx.  I would treat as asthma attack for now unless she develops a fever.

## 2012-11-07 ENCOUNTER — Ambulatory Visit (INDEPENDENT_AMBULATORY_CARE_PROVIDER_SITE_OTHER): Payer: 59 | Admitting: Family Medicine

## 2012-11-07 ENCOUNTER — Encounter: Payer: Self-pay | Admitting: Family Medicine

## 2012-11-07 VITALS — BP 120/70 | HR 63 | Temp 98.9°F | Resp 22 | Wt 193.0 lb

## 2012-11-07 DIAGNOSIS — J45901 Unspecified asthma with (acute) exacerbation: Secondary | ICD-10-CM

## 2012-11-07 DIAGNOSIS — J45909 Unspecified asthma, uncomplicated: Secondary | ICD-10-CM

## 2012-11-07 DIAGNOSIS — Z1211 Encounter for screening for malignant neoplasm of colon: Secondary | ICD-10-CM

## 2012-11-07 DIAGNOSIS — E785 Hyperlipidemia, unspecified: Secondary | ICD-10-CM

## 2012-11-07 DIAGNOSIS — N393 Stress incontinence (female) (male): Secondary | ICD-10-CM

## 2012-11-07 DIAGNOSIS — Z1239 Encounter for other screening for malignant neoplasm of breast: Secondary | ICD-10-CM

## 2012-11-07 MED ORDER — LOVASTATIN 20 MG PO TABS: 20.0000 mg | ORAL_TABLET | Freq: Every day | ORAL | Status: DC

## 2012-11-07 NOTE — Progress Notes (Signed)
Subjective:    Patient ID: Jasmine Fields, female    DOB: 03-05-1941, 72 y.o.   MRN: [id number]  HPI 11/04/12 Admitted at the end of April for epiglottitis. She was treated with high-dose IV antibiotics and outpatient doxycycline. She experienced a complete remission of her symptoms. 5 days ago she developed sudden onset of shortness of breath and wheezing and coughing after mowing her yard. Today in clinic she is coughing constantly, wheezing, with slight accessory muscle usage.  She denies any fevers or chills. She is also here today to discuss her lab work that was drawn previously however the urgency of her respiratory complaints has taken priority.  At that visit, I diagnosed her with: 1. Asthma attack Patient symptomatically sounds like an asthma exacerbation. She was given 60 mg of Depo-Medrol IM x1. She was given an initial albuterol 2.5 mg neb x1. This was administered at 3:30. She did not experience improvement by 3:45.  At 4:00 combination neb of 3 mg of albuterol and 0.5 mg of Atrovent was administered.  The patient began to feel better. At that time I recommended she do get chest x-ray to route a right lower lobe pneumonia as well as were appreciated in the right lower lobe. Meanwhile I would like her to start prednisone 20 mg tablets. She states 3 tablets on days 1 and 2, 2 tablets on days 3 and 4, 1 tablet on day 5 and 6.  She can use Ventolin 2 puffs inhaled every 6 hours when necessary wheezing. If the chest x-ray shows an infection I will call antibiotics as well. - methylPREDNISolone acetate (DEPO-MEDROL) injection 60 mg; Inject 1.5 mLs (60 mg total) into the muscle once. - albuterol (PROVENTIL) (2.5 MG/3ML) 0.083% nebulizer solution 2.5 mg; Take 3 mLs (2.5 mg total) by nebulization once.  11/07/12 Today is doing better. Her pulse ox is 90% but she is breathing more easily. Her lungs are clearer, and she is wheezing much less.  She still has a cough productive of clear sputum.  She  also like to discuss her lab work. The labs are listed below: No visits with results within 1 Week(s) from this visit. Latest known visit with results is:  Lab on 10/29/2012  Component Date Value Range Status  . Cholesterol 10/29/2012 242* 0 - 200 mg/dL Final   Comment: ATP III Classification:                                < 200        mg/dL        Desirable                               200 - 239     mg/dL        Borderline High                               >= 240        mg/dL        High                             . Triglycerides 10/29/2012 248* <150 mg/dL Final  . HDL 08/65/7846 36* >39 mg/dL Final  . Total CHOL/HDL Ratio 10/29/2012 6.7  Final  . VLDL 10/29/2012 50* 0 - 40 mg/dL Final  . LDL Cholesterol 10/29/2012 156* 0 - 99 mg/dL Final   Comment:                            Total Cholesterol/HDL Ratio:CHD Risk                                                 Coronary Heart Disease Risk Table                                                                 Men       Women                                   1/2 Average Risk              3.4        3.3                                       Average Risk              5.0        4.4                                    2X Average Risk              9.6        7.1                                    3X Average Risk             23.4       11.0                          Use the calculated Patient Ratio above and the CHD Risk table                           to determine the patient's CHD Risk.                          ATP III Classification (LDL):                                < 100        mg/dL         Optimal                               100 - 129  mg/dL         Near or Above Optimal                               130 - 159     mg/dL         Borderline High                               160 - 189     mg/dL         High                                > 190        mg/dL         Very High                             . WBC 10/29/2012 6.6   4.0 - 10.5 K/uL Final  . RBC 10/29/2012 4.84  3.87 - 5.11 MIL/uL Final  . Hemoglobin 10/29/2012 14.1  12.0 - 15.0 g/dL Final  . HCT 40/98/1191 42.3  36.0 - 46.0 % Final  . MCV 10/29/2012 87.4  78.0 - 100.0 fL Final  . MCH 10/29/2012 29.1  26.0 - 34.0 pg Final  . MCHC 10/29/2012 33.3  30.0 - 36.0 g/dL Final  . RDW 47/82/9562 15.5  11.5 - 15.5 % Final  . Platelets 10/29/2012 352  150 - 400 K/uL Final  . Neutrophils Relative % 10/29/2012 41* 43 - 77 % Final  . Neutro Abs 10/29/2012 2.7  1.7 - 7.7 K/uL Final  . Lymphocytes Relative 10/29/2012 46  12 - 46 % Final  . Lymphs Abs 10/29/2012 3.0  0.7 - 4.0 K/uL Final  . Monocytes Relative 10/29/2012 8  3 - 12 % Final  . Monocytes Absolute 10/29/2012 0.5  0.1 - 1.0 K/uL Final  . Eosinophils Relative 10/29/2012 4  0 - 5 % Final  . Eosinophils Absolute 10/29/2012 0.2  0.0 - 0.7 K/uL Final  . Basophils Relative 10/29/2012 1  0 - 1 % Final  . Basophils Absolute 10/29/2012 0.1  0.0 - 0.1 K/uL Final  . Smear Review 10/29/2012 Criteria for review not met   Final  . Sodium 10/29/2012 140  135 - 145 mEq/L Final  . Potassium 10/29/2012 5.5* 3.5 - 5.3 mEq/L Final   No visible hemolysis.  . Chloride 10/29/2012 103  96 - 112 mEq/L Final  . CO2 10/29/2012 27  19 - 32 mEq/L Final  . Glucose, Bld 10/29/2012 88  70 - 99 mg/dL Final  . BUN 13/12/6576 13  6 - 23 mg/dL Final  . Creat 46/96/2952 0.83  0.50 - 1.10 mg/dL Final  . Total Bilirubin 10/29/2012 0.5  0.3 - 1.2 mg/dL Final  . Alkaline Phosphatase 10/29/2012 77  39 - 117 U/L Final  . AST 10/29/2012 11  0 - 37 U/L Final  . ALT 10/29/2012 8  0 - 35 U/L Final  . Total Protein 10/29/2012 6.8  6.0 - 8.3 g/dL Final  . Albumin 84/13/2440 3.8  3.5 - 5.2 g/dL Final  . Calcium 03/25/2535 9.5  8.4 - 10.5 mg/dL Final  . GFR, Est African American 10/29/2012 83   Final  . GFR, Est Non African American 10/29/2012  72   Final   Comment:                            The estimated GFR is a calculation valid for adults  (>=51 years old)                          that uses the CKD-EPI algorithm to adjust for age and sex. It is                            not to be used for children, pregnant women, hospitalized patients,                             patients on dialysis, or with rapidly changing kidney function.                          According to the NKDEP, eGFR >89 is normal, 60-89 shows mild                          impairment, 30-59 shows moderate impairment, 15-29 shows severe                          impairment and <15 is ESRD.                             She independently stopped the simvastatin due to cost. She has been off her cholesterol medicine for months.  She is overdue for a colonoscopy and a mammogram. She asked that I schedule this today. She also is having stress incontinence. She can barely walk 20-30 yards without having to change a pad.  She also has urge incontinence.  She is constantly leaking. She would like to discuss surgical options to correct this the  Past Medical History  Diagnosis Date  . Hypothyroidism   . Aneurysm   . Hypertension   . Hyperlipidemia   . CHF (congestive heart failure)   . Low back pain   . Osteoarthritis   . IBS (irritable bowel syndrome)     With constipation  . Aneurysm of anterior cerebral artery   . Osteoporosis   . Takotsubo cardiomyopathy    Current Outpatient Prescriptions on File Prior to Visit  Medication Sig Dispense Refill  . albuterol (PROVENTIL HFA;VENTOLIN HFA) 108 (90 BASE) MCG/ACT inhaler Inhale 2 puffs into the lungs every 6 (six) hours as needed for wheezing.  1 Inhaler  0  . aspirin 81 MG tablet Take 81 mg by mouth daily.        Marland Kitchen azithromycin (ZITHROMAX) 250 MG tablet Z-pack as directed  6 each  0  . bumetanide (BUMEX) 1 MG tablet Take 1 mg by mouth daily. 1/2 pill a day       . carvedilol (COREG) 6.25 MG tablet Take 6.25 mg by mouth 2 (two) times daily with a meal.        . levothyroxine (SYNTHROID, LEVOTHROID) 100 MCG tablet Take  100 mcg by mouth daily.        Marland Kitchen lisinopril (PRINIVIL,ZESTRIL) 40 MG tablet Take 40 mg by mouth daily.        . predniSONE (DELTASONE) 20 MG  tablet 3 tabs poqday 1-2, 2 tabs poqday 3-4, 1 tab poqday 5-6  12 tablet  0  . amitriptyline (ELAVIL) 10 MG tablet Take 10 mg by mouth at bedtime.         No current facility-administered medications on file prior to visit.   Allergies  Allergen Reactions  . Alendronate Sodium     Upset stomach   . Amlodipine     swelling  . Boniva (Ibandronate Sodium)     Upset stomach  . Latex Rash   History   Social History  . Marital Status: Married    Spouse Name: N/A    Number of Children: N/A  . Years of Education: N/A   Occupational History  . Not on file.   Social History Main Topics  . Smoking status: Former Smoker -- 1.50 packs/day for 30 years    Quit date: 05/30/1999  . Smokeless tobacco: Not on file  . Alcohol Use: No  . Drug Use: No  . Sexually Active: Not on file   Other Topics Concern  . Not on file   Social History Narrative   Married and lives with spouse      Review of Systems  All other systems reviewed and are negative.       Objective:   Physical Exam  Vitals reviewed. Constitutional: She appears well-developed and well-nourished.  Cardiovascular: Normal rate and normal heart sounds.   No murmur heard. Pulmonary/Chest: No accessory muscle usage. Not tachypneic. No respiratory distress. She has no decreased breath sounds. She has no wheezes. She has rhonchi in the right upper field, the right lower field, the left upper field and the left lower field. She has no rales.  Abdominal: Soft. Bowel sounds are normal.          Assessment & Plan:   1. HLD (hyperlipidemia) Begin lovastatin 20 mg by mouth daily and recheck fasting lipid panel in 3 months the - lovastatin (MEVACOR) 20 MG tablet; Take 1 tablet (20 mg total) by mouth at bedtime.  Dispense: 30 tablet; Refill: 3  2. Asthma attack Slowly improving.  Finish prednisone and use albuterol when necessary. Begin Mucinex 400 mg every 6 hours. Encouraged incentive spirometry.  Finish Z-Pak. Return if symptoms worsen or are no better in one week.  3. Stress incontinence Patient may benefit from a bladder tack. I will consult - Ambulatory referral to Urology  4. Screening for colon cancer Schedule screening colonoscopy. - Ambulatory referral to Gastroenterology  5. Screening for breast cancer Schedule screening mammogram the - MM Digital Screening; Future

## 2012-11-08 ENCOUNTER — Telehealth: Payer: Self-pay | Admitting: Family Medicine

## 2012-11-08 NOTE — Telephone Encounter (Signed)
Patient aware.

## 2012-11-08 NOTE — Telephone Encounter (Signed)
It is ok to use

## 2012-11-14 ENCOUNTER — Other Ambulatory Visit: Payer: Self-pay | Admitting: Family Medicine

## 2012-11-15 ENCOUNTER — Telehealth: Payer: Self-pay | Admitting: Family Medicine

## 2012-11-15 MED ORDER — LISINOPRIL 40 MG PO TABS: 40.0000 mg | ORAL_TABLET | Freq: Every day | ORAL | Status: DC

## 2012-11-15 NOTE — Telephone Encounter (Signed)
Rx Refilled  

## 2012-12-03 ENCOUNTER — Encounter: Payer: Self-pay | Admitting: Family Medicine

## 2012-12-10 ENCOUNTER — Ambulatory Visit: Payer: Medicare PPO

## 2012-12-19 ENCOUNTER — Ambulatory Visit: Payer: Medicare PPO | Admitting: Cardiology

## 2013-01-06 ENCOUNTER — Other Ambulatory Visit: Payer: Self-pay | Admitting: Family Medicine

## 2013-02-07 ENCOUNTER — Ambulatory Visit: Payer: 59 | Admitting: Family Medicine

## 2013-02-14 ENCOUNTER — Encounter: Payer: Self-pay | Admitting: Family Medicine

## 2013-02-14 ENCOUNTER — Ambulatory Visit (INDEPENDENT_AMBULATORY_CARE_PROVIDER_SITE_OTHER): Payer: 59 | Admitting: Family Medicine

## 2013-02-14 VITALS — BP 140/80 | HR 78 | Temp 100.0°F | Resp 20 | Wt 194.0 lb

## 2013-02-14 DIAGNOSIS — B349 Viral infection, unspecified: Secondary | ICD-10-CM

## 2013-02-14 DIAGNOSIS — B9789 Other viral agents as the cause of diseases classified elsewhere: Secondary | ICD-10-CM

## 2013-02-14 DIAGNOSIS — I10 Essential (primary) hypertension: Secondary | ICD-10-CM

## 2013-02-14 DIAGNOSIS — E039 Hypothyroidism, unspecified: Secondary | ICD-10-CM

## 2013-02-14 DIAGNOSIS — E785 Hyperlipidemia, unspecified: Secondary | ICD-10-CM

## 2013-02-14 DIAGNOSIS — R509 Fever, unspecified: Secondary | ICD-10-CM

## 2013-02-14 LAB — COMPREHENSIVE METABOLIC PANEL
ALT: 10 U/L (ref 0–35)
Alkaline Phosphatase: 74 U/L (ref 39–117)
CO2: 28 mEq/L (ref 19–32)
Chloride: 103 mEq/L (ref 96–112)
Creat: 0.75 mg/dL (ref 0.50–1.10)
Potassium: 4.7 mEq/L (ref 3.5–5.3)
Total Bilirubin: 0.7 mg/dL (ref 0.3–1.2)
Total Protein: 7.7 g/dL (ref 6.0–8.3)

## 2013-02-14 LAB — LIPID PANEL
Cholesterol: 197 mg/dL (ref 0–200)
HDL: 38 mg/dL — ABNORMAL LOW (ref >39)
Total CHOL/HDL Ratio: 5.2 Ratio

## 2013-02-14 LAB — CBC WITH DIFFERENTIAL/PLATELET
Basophils Absolute: 0 10*3/uL (ref 0.0–0.1)
Eosinophils Absolute: 0.3 10*3/uL (ref 0.0–0.7)
Eosinophils Relative: 2 % (ref 0–5)
HCT: 43.4 % (ref 36.0–46.0)
Lymphocytes Relative: 15 % (ref 12–46)
Lymphs Abs: 2.2 10*3/uL (ref 0.7–4.0)
MCH: 30.2 pg (ref 26.0–34.0)
MCV: 89.9 fL (ref 78.0–100.0)
Monocytes Absolute: 1.1 10*3/uL — ABNORMAL HIGH (ref 0.1–1.0)
Platelets: 364 10*3/uL (ref 150–400)
RDW: 14.6 % (ref 11.5–15.5)
WBC: 15.1 10*3/uL — ABNORMAL HIGH (ref 4.0–10.5)

## 2013-02-14 NOTE — Progress Notes (Signed)
  Subjective:    Patient ID: Sanjuana Kava, female    DOB: 1940-10-24, 72 y.o.   MRN: [id number]  HPI  Patient here with fever and body aches since yesterday. Positive sick contacts and she was in the nursing home visiting some family. She denies any diarrhea vomiting or rash. MAXIMUM TEMPERATURE is unknown. She has been taking Tylenol. She has  Cough started today has mild production of white phlegm. She was concerned that she had the flu .  Also like to have her fasting labs done  Review of Systems GEN- + fatigue, +fever, weight loss,weakness, recent illness HEENT- denies eye drainage, change in vision, nasal discharge, CVS- denies chest pain, palpitations RESP- denies SOB,+ cough, wheeze ABD- denies N/V, change in stools, abd pain GU- denies dysuria, hematuria, dribbling, incontinence MSK- denies joint pain,+ muscle aches, injury Neuro- denies headache, dizziness, syncope, seizure activity      Objective:   Physical Exam GEN- NAD, alert and oriented x3, fatigued appearing HEENT- PERRL, EOMI, non injected sclera, pink conjunctiva, MMM, oropharynx clear, TM clear bilat no effusion, no maxillary sinus tenderness, nares clear Neck- Supple, no LAD CVS- RRR, no murmur RESP-CTAB ABD-NABS,soft,NT,ND EXT- No edema Pulses- Radial 2+         Assessment & Plan:

## 2013-02-14 NOTE — Patient Instructions (Signed)
Try plain robitussin or Coricidan ( has  Heart on it)  Tylenol for fever and body aches Plenty of fluids Albuterol at bedtime or as needed during the day Call if you do not improve

## 2013-02-15 DIAGNOSIS — B349 Viral infection, unspecified: Secondary | ICD-10-CM | POA: Insufficient documentation

## 2013-02-15 LAB — TSH: TSH: 0.64 u[IU]/mL (ref 0.350–4.500)

## 2013-02-15 NOTE — Assessment & Plan Note (Addendum)
Flu like symptoms, negative influenza check Supportive care She has no part D for her meds Advised robitussin OTC for cough, plenty of fluids, tylenol She will call if symptoms change, very early in course of illness CBC does show leukocytosis- hold on antibiotics at this time,

## 2013-02-17 ENCOUNTER — Telehealth: Payer: Self-pay | Admitting: Family Medicine

## 2013-02-17 MED ORDER — AMOXICILLIN 500 MG PO CAPS: 500.0000 mg | ORAL_CAPSULE | Freq: Three times a day (TID) | ORAL | Status: DC

## 2013-02-17 NOTE — Telephone Encounter (Signed)
Pt was in on Friday and she says that she has not gotten the medication yet because she has been to sick to go out. Ever since Friday she has felt like her throat is closing. She wants to know what she should do? She said she is not having trouble breathing.

## 2013-02-17 NOTE — Telephone Encounter (Signed)
Spoke with pt having phargynitis symptoms, red throat, fever yesterday none today, no difficulty breathing, just pain with swallowing Still with some cough Has been taking Tylenol, helps with fever WBC elevated 15,000, will treat with amoxicillin 1 tab TID, Unable to take NSAIDS. Push fluids, given red flags

## 2013-02-19 ENCOUNTER — Telehealth: Payer: Self-pay | Admitting: Family Medicine

## 2013-02-19 NOTE — Telephone Encounter (Signed)
She should continue the amoxicillin and robitussin. I would not advise prednisone at this time She can come in for a recheck if needed

## 2013-02-19 NOTE — Telephone Encounter (Signed)
Patient aware.

## 2013-02-19 NOTE — Telephone Encounter (Signed)
Pt calls and states that she did start taking the amoxicillin and she is no better at all and would like to know if she could have some prednisone? (Dr. Tanya Nones just put her husband on a taper dose as well)

## 2013-03-05 ENCOUNTER — Other Ambulatory Visit: Payer: Self-pay | Admitting: Family Medicine

## 2013-03-06 ENCOUNTER — Ambulatory Visit: Payer: 59 | Admitting: Family Medicine

## 2013-03-06 NOTE — Telephone Encounter (Signed)
Medication refilled per protocol. 

## 2013-03-17 ENCOUNTER — Encounter: Payer: Self-pay | Admitting: Family Medicine

## 2013-03-17 ENCOUNTER — Ambulatory Visit (INDEPENDENT_AMBULATORY_CARE_PROVIDER_SITE_OTHER): Payer: 59 | Admitting: Family Medicine

## 2013-03-17 VITALS — BP 130/68 | HR 65 | Temp 98.6°F | Resp 16 | Wt 195.0 lb

## 2013-03-17 DIAGNOSIS — M25519 Pain in unspecified shoulder: Secondary | ICD-10-CM

## 2013-03-17 DIAGNOSIS — M25511 Pain in right shoulder: Secondary | ICD-10-CM

## 2013-03-17 DIAGNOSIS — Z23 Encounter for immunization: Secondary | ICD-10-CM

## 2013-03-17 DIAGNOSIS — E785 Hyperlipidemia, unspecified: Secondary | ICD-10-CM

## 2013-03-17 DIAGNOSIS — I10 Essential (primary) hypertension: Secondary | ICD-10-CM

## 2013-03-17 NOTE — Progress Notes (Signed)
Subjective:    Patient ID: Jasmine Fields, female    DOB: [birth year], 72 y.o.   MRN: [id number]  HPI  Patient has a history of hypertension, hyperlipidemia, congestive heart failure secondary to Takotsubo syndrome, and hypothyroidism. She is here today for general medical check. Her blood pressure is well-controlled at 130/68. She denies any chest pain, shortness of breath, dyspnea on exertion. She does report occasional wheezing and asthma-like symptoms after she mows the yard. She has not tried any regular asthma medications other than albuterol. This seems to help improve her symptoms. The only trigger seems to be being outdoors or mowing the yard. She has not tried any allergy medication as a preventative. She denies any myalgia or right upper quadrant pain on her statin medication. Her most recent lab work is listed below. No visits with results within 1 Month(s) from this visit. Latest known visit with results is:  Office Visit on 02/14/2013  Component Date Value Range Status  . Source-INFBD 02/14/2013 NASAL   Final  . Inflenza A Ag 02/14/2013 NEG  Negative Final  . Influenza B Ag 02/14/2013 NEG  Negative Final  . TSH 02/14/2013 0.640  0.350 - 4.500 uIU/mL Final  . Free T4 02/14/2013 1.44  0.80 - 1.80 ng/dL Final  . Sodium 40/98/1191 138  135 - 145 mEq/L Final  . Potassium 02/14/2013 4.7  3.5 - 5.3 mEq/L Final  . Chloride 02/14/2013 103  96 - 112 mEq/L Final  . CO2 02/14/2013 28  19 - 32 mEq/L Final  . Glucose, Bld 02/14/2013 76  70 - 99 mg/dL Final  . BUN 47/82/9562 9  6 - 23 mg/dL Final  . Creat 13/12/6576 0.75  0.50 - 1.10 mg/dL Final  . Total Bilirubin 02/14/2013 0.7  0.3 - 1.2 mg/dL Final  . Alkaline Phosphatase 02/14/2013 74  39 - 117 U/L Final  . AST 02/14/2013 13  0 - 37 U/L Final  . ALT 02/14/2013 10  0 - 35 U/L Final  . Total Protein 02/14/2013 7.7  6.0 - 8.3 g/dL Final  . Albumin 46/96/2952 4.4  3.5 - 5.2 g/dL Final  . Calcium 84/13/2440 9.7  8.4 - 10.5 mg/dL Final   . Cholesterol 03/25/2535 197  0 - 200 mg/dL Final   Comment: ATP III Classification:                                < 200        mg/dL        Desirable                               200 - 239     mg/dL        Borderline High                               >= 240        mg/dL        High                             . Triglycerides 02/14/2013 262* <150 mg/dL Final  . HDL 64/40/3474 38* >39 mg/dL Final  . Total CHOL/HDL Ratio 02/14/2013 5.2   Final  . VLDL 02/14/2013 52* 0 - 40 mg/dL  Final  . LDL Cholesterol 02/14/2013 107* 0 - 99 mg/dL Final   Comment:                            Total Cholesterol/HDL Ratio:CHD Risk                                                 Coronary Heart Disease Risk Table                                                                 Men       Women                                   1/2 Average Risk              3.4        3.3                                       Average Risk              5.0        4.4                                    2X Average Risk              9.6        7.1                                    3X Average Risk             23.4       11.0                          Use the calculated Patient Ratio above and the CHD Risk table                           to determine the patient's CHD Risk.                          ATP III Classification (LDL):                                < 100        mg/dL         Optimal                               100 - 129     mg/dL  Near or Above Optimal                               130 - 159     mg/dL         Borderline High                               160 - 189     mg/dL         High                                > 190        mg/dL         Very High                             . WBC 02/14/2013 15.1* 4.0 - 10.5 K/uL Final  . RBC 02/14/2013 4.83  3.87 - 5.11 MIL/uL Final  . Hemoglobin 02/14/2013 14.6  12.0 - 15.0 g/dL Final  . HCT 96/08/5407 43.4  36.0 - 46.0 % Final  . MCV 02/14/2013 89.9  78.0 - 100.0 fL  Final  . MCH 02/14/2013 30.2  26.0 - 34.0 pg Final  . MCHC 02/14/2013 33.6  30.0 - 36.0 g/dL Final  . RDW 81/19/1478 14.6  11.5 - 15.5 % Final  . Platelets 02/14/2013 364  150 - 400 K/uL Final  . Neutrophils Relative % 02/14/2013 76  43 - 77 % Final  . Neutro Abs 02/14/2013 11.4* 1.7 - 7.7 K/uL Final  . Lymphocytes Relative 02/14/2013 15  12 - 46 % Final  . Lymphs Abs 02/14/2013 2.2  0.7 - 4.0 K/uL Final  . Monocytes Relative 02/14/2013 7  3 - 12 % Final  . Monocytes Absolute 02/14/2013 1.1* 0.1 - 1.0 K/uL Final  . Eosinophils Relative 02/14/2013 2  0 - 5 % Final  . Eosinophils Absolute 02/14/2013 0.3  0.0 - 0.7 K/uL Final  . Basophils Relative 02/14/2013 0  0 - 1 % Final  . Basophils Absolute 02/14/2013 0.0  0.0 - 0.1 K/uL Final  . Smear Review 02/14/2013 Criteria for review not met   Final   Her TSH is therapeutic. Her liver function and kidney function is within normal limits.  Her LDL has decreased approximately 50 points close to the goal of 100.  However she reports pain in her right shoulder. She is unable to abduct the arm above 90. It throbs like a toothache at night. She reports pain with range of motion. She denies any specific injury. She denies any neck pain. She denies any paresthesias or numbness in the hand. Past Medical History  Diagnosis Date  . Hypothyroidism   . Aneurysm   . Hypertension   . Hyperlipidemia   . CHF (congestive heart failure)   . Low back pain   . Osteoarthritis   . IBS (irritable bowel syndrome)     With constipation  . Aneurysm of anterior cerebral artery   . Osteoporosis   . Takotsubo cardiomyopathy    Past Surgical History  Procedure Laterality Date  . Cerebral artery coil  2005  . Ectopic pregnancy surgery  1964    Right, SOO  . Cholecystectomy  1960  . Cardiac catheterization  06/2007    Normal  arteries   Current Outpatient Prescriptions on File Prior to Visit  Medication Sig Dispense Refill  . albuterol (PROVENTIL HFA;VENTOLIN  HFA) 108 (90 BASE) MCG/ACT inhaler Inhale 2 puffs into the lungs every 6 (six) hours as needed for wheezing.  1 Inhaler  0  . amitriptyline (ELAVIL) 10 MG tablet Take 10 mg by mouth at bedtime.        Marland Kitchen aspirin 81 MG tablet Take 81 mg by mouth daily.        . bumetanide (BUMEX) 1 MG tablet TAKE ONE-HALF TABLET BY MOUTH EVERY DAY  15 tablet  5  . carvedilol (COREG) 6.25 MG tablet TAKE ONE TABLET BY MOUTH TWICE DAILY  60 tablet  3  . levothyroxine (SYNTHROID, LEVOTHROID) 100 MCG tablet TAKE ONE TABLET BY MOUTH EVERY DAY  30 tablet  3  . lisinopril (PRINIVIL,ZESTRIL) 40 MG tablet Take 1 tablet (40 mg total) by mouth daily.  30 tablet  3  . lovastatin (MEVACOR) 20 MG tablet Take 1 tablet (20 mg total) by mouth at bedtime.  30 tablet  3  . amoxicillin (AMOXIL) 500 MG capsule Take 1 capsule (500 mg total) by mouth 3 (three) times daily.  30 capsule  0   No current facility-administered medications on file prior to visit.   Allergies  Allergen Reactions  . Alendronate Sodium     Upset stomach   . Amlodipine     swelling  . Boniva [Ibandronate Sodium]     Upset stomach  . Latex Rash   History   Social History  . Marital Status: Married    Spouse Name: N/A    Number of Children: N/A  . Years of Education: N/A   Occupational History  . Not on file.   Social History Main Topics  . Smoking status: Former Smoker -- 1.50 packs/day for 30 years    Quit date: 05/30/1999  . Smokeless tobacco: Not on file  . Alcohol Use: No  . Drug Use: No  . Sexual Activity: Not on file   Other Topics Concern  . Not on file   Social History Narrative   Married and lives with spouse     Review of Systems  All other systems reviewed and are negative.       Objective:   Physical Exam  Vitals reviewed. Constitutional: She appears well-developed and well-nourished.  Neck: Neck supple. No JVD present. No thyromegaly present.  Cardiovascular: Normal rate, regular rhythm and normal heart sounds.   Exam reveals no gallop and no friction rub.   No murmur heard. Pulmonary/Chest: Effort normal and breath sounds normal. No respiratory distress. She has no wheezes. She has no rales. She exhibits no tenderness.  Abdominal: Soft. Bowel sounds are normal. She exhibits no distension and no mass. There is no tenderness. There is no rebound and no guarding.  Lymphadenopathy:    She has no cervical adenopathy.   she is a positive empty can sign in the right shoulder. She has pain with abduction greater than 90. She has positive Hawkins maneuver.        Assessment & Plan:  1. Need for prophylactic vaccination and inoculation against influenza  - Flu Vaccine QUAD 36+ mos PF IM (Fluarix)  2. HTN (hypertension) Blood pressures well controlled. Continue current medications at their present dosages.  3. HLD (hyperlipidemia) Triglycerides are elevated spell. HDL is low. LDL is close to goal of 100.  I recommended increasing aerobic exercise to try to compensate and drive down  her LDL further and increase her HDL. Also recommended a low carb low saturated fat diet to address her dyslipidemia the  4. Right shoulder pain Patient's physical exam is consistent with subacromial bursitis and supraspinatous tendinitis. After obtaining informed consent, using sterile technique, I injected the right subacromial space with 2 cc of lidocaine, 2 cc Marcaine, and 2 cc of 40 mg per mL Kenalog. Patient tolerated procedure without complication.

## 2013-04-03 ENCOUNTER — Other Ambulatory Visit: Payer: 59 | Admitting: Physician Assistant

## 2013-04-09 ENCOUNTER — Other Ambulatory Visit: Payer: Self-pay | Admitting: Family Medicine

## 2013-04-14 ENCOUNTER — Telehealth: Payer: Self-pay | Admitting: Family Medicine

## 2013-04-14 NOTE — Telephone Encounter (Signed)
Patient wants to know if you can call her in something for her cough and congestion?   [facility]  [employer]

## 2013-04-15 ENCOUNTER — Encounter: Payer: Self-pay | Admitting: Family Medicine

## 2013-04-15 NOTE — Telephone Encounter (Signed)
This encounter was created in error - please disregard.

## 2013-04-15 NOTE — Telephone Encounter (Signed)
Message copied by Samuella Cota on Tue Apr 15, 2013  7:48 AM ------      Message from: Milinda Antis F      Created: Mon Apr 14, 2013  5:12 PM       The message was not sent to me, she can take OTC Robitussin DM or Coricidan, , or mucinex DM       No decongestants due to her blood pressure      Schedule appt if needed      ----- Message -----         From: Elvina Mattes, CMA         Sent: 04/14/2013   4:38 PM           To: Salley Scarlet, MD            Can we call something in for her?      ----- Message -----         From: Eustaquio Maize         Sent: 04/14/2013   8:15 AM           To: Elvina Mattes, CMA            Severe head/chest congestion- no fever-She wants something called in  [place], [place] . Call her please [phone]             ------

## 2013-04-15 NOTE — Telephone Encounter (Signed)
Pt has tried otc cough meds and nothing helps so she made an appt.

## 2013-04-15 NOTE — Telephone Encounter (Signed)
Patient would like to have something called in for cough and congestion.   [facility] [employer]

## 2013-04-15 NOTE — Telephone Encounter (Signed)
Left message on voicmail for pt to return my call

## 2013-04-16 ENCOUNTER — Ambulatory Visit (INDEPENDENT_AMBULATORY_CARE_PROVIDER_SITE_OTHER): Payer: 59 | Admitting: Physician Assistant

## 2013-04-16 ENCOUNTER — Encounter: Payer: Self-pay | Admitting: Physician Assistant

## 2013-04-16 VITALS — BP 150/84 | HR 64 | Temp 97.2°F | Resp 22 | Ht 65.0 in | Wt 195.0 lb

## 2013-04-16 DIAGNOSIS — J209 Acute bronchitis, unspecified: Secondary | ICD-10-CM

## 2013-04-16 MED ORDER — AZITHROMYCIN 250 MG PO TABS: ORAL_TABLET | ORAL | Status: DC

## 2013-04-16 MED ORDER — PREDNISONE 20 MG PO TABS: 20.0000 mg | ORAL_TABLET | Freq: Every day | ORAL | Status: DC

## 2013-04-16 NOTE — Progress Notes (Signed)
Patient ID: Jasmine Fields MRN: [id number], DOB: [birth year], 72 y.o. Date of Encounter: 04/16/2013, 2:35 PM    Chief Complaint:  Chief Complaint  Patient presents with  . Chest congest, cough, fever - off & on x 14 days     HPI: 72 y.o. year old white female reports that she has been sick for about 2 weeks. Says that she is getting thick dark mucus out of her nose and also thick dark mucus phlegm out of her chest. Says it is bad in both her head and chest. Has had some low-grade fever off and on over the past couple weeks. No sore throat or ear ache. Did use albuterol inhaler prior to coming to the visit today. In general says that she has used it maybe about twice per day over the last week.     Home Meds: See attached medication section for any medications that were entered at today's visit. The computer does not put those onto this list.The following list is a list of meds entered prior to today's visit.   Current Outpatient Prescriptions on File Prior to Visit  Medication Sig Dispense Refill  . albuterol (PROVENTIL HFA;VENTOLIN HFA) 108 (90 BASE) MCG/ACT inhaler Inhale 2 puffs into the lungs every 6 (six) hours as needed for wheezing.  1 Inhaler  0  . amitriptyline (ELAVIL) 10 MG tablet Take 10 mg by mouth at bedtime.        Marland Kitchen aspirin 81 MG tablet Take 81 mg by mouth daily.        . bumetanide (BUMEX) 1 MG tablet TAKE ONE-HALF TABLET BY MOUTH EVERY DAY  15 tablet  5  . carvedilol (COREG) 6.25 MG tablet TAKE ONE TABLET BY MOUTH TWICE DAILY  60 tablet  3  . levothyroxine (SYNTHROID, LEVOTHROID) 100 MCG tablet TAKE ONE TABLET BY MOUTH EVERY DAY  30 tablet  3  . lisinopril (PRINIVIL,ZESTRIL) 40 MG tablet Take 1 tablet (40 mg total) by mouth daily.  30 tablet  3  . lovastatin (MEVACOR) 20 MG tablet TAKE ONE TABLET BY MOUTH AT BEDTIME  30 tablet  0   No current facility-administered medications on file prior to visit.    Allergies:  Allergies  Allergen Reactions  . Alendronate  Sodium     Upset stomach   . Amlodipine     swelling  . Boniva [Ibandronate Sodium]     Upset stomach  . Latex Rash      Review of Systems: See HPI for pertinent ROS. All other ROS negative.    Physical Exam: Blood pressure 150/84, pulse 64, temperature 97.2 F (36.2 C), temperature source Oral, resp. rate 22, height 5\' 5"  (1.651 m), weight 195 lb (88.451 kg)., Body mass index is 32.45 kg/(m^2). General: WF  Appears in no acute distress. HEENT: Normocephalic, atraumatic, eyes without discharge, sclera non-icteric, nares are without discharge. Bilateral auditory canals clear, TM's are without perforation, pearly grey and translucent with reflective cone of light bilaterally. Oral cavity moist, posterior pharynx without exudate, erythema, peritonsillar abscess. No sinus tenderness with percussion.  Neck: Supple. No thyromegaly. No lymphadenopathy. Lungs: She does have since mild wheezes bilaterally throughout. However there is good airflow. Heart: Regular rhythm. No murmurs, rubs, or gallops. Msk:  Strength and tone normal for age. Neuro: Alert and oriented X 3. Moves all extremities spontaneously. Gait is normal. CNII-XII grossly in tact. Psych:  Responds to questions appropriately with a normal affect.     ASSESSMENT AND PLAN:  72 y.o.  year old female with  1. Acute bronchitis - azithromycin (ZITHROMAX) 250 MG tablet; Day 1: Take 2 daily.   Days 2-5: Take 1 daily.  Dispense: 6 tablet; Refill: 0 - predniSONE (DELTASONE) 20 MG tablet; Take 1 tablet (20 mg total) by mouth daily with breakfast. Take 2 pills daily for 5 days.  Dispense: 10 tablet; Refill: 0  Recommended she use the albuterol inhaler 4 times a day over the next week or so. Also complete all of the antibiotic and prednisone as directed. Followup if symptoms worsen or symptoms do not resolve after completion of medications. Also recommend Mucinex DM as expectorant.  [address] [place], [place], [facility] 04/16/2013 2:35  PM

## 2013-05-05 ENCOUNTER — Telehealth: Payer: Self-pay | Admitting: Family Medicine

## 2013-05-05 NOTE — Telephone Encounter (Signed)
Pt has completed all medications given at her lov for the bronchitis and she is still wheezing and coughing and wants to know if you could give her something else to help "knock it out"?

## 2013-05-05 NOTE — Telephone Encounter (Signed)
Pt called, left mess, to call and make appt to be seen.

## 2013-05-05 NOTE — Telephone Encounter (Signed)
She needs to come in for office visit so we can reexamine her.

## 2013-05-06 ENCOUNTER — Ambulatory Visit: Payer: Self-pay | Admitting: Family Medicine

## 2013-05-09 ENCOUNTER — Telehealth: Payer: Self-pay | Admitting: Family Medicine

## 2013-05-09 DIAGNOSIS — J209 Acute bronchitis, unspecified: Secondary | ICD-10-CM

## 2013-05-09 MED ORDER — AZITHROMYCIN 250 MG PO TABS: ORAL_TABLET | ORAL | Status: DC

## 2013-05-09 NOTE — Telephone Encounter (Signed)
Pt wants to know if we can call her in something for her coughing and wheezing?  Per Dr. Tanya Nones ok to call out Zpack and if she gets worse she needs to be seen at ER or Urgent Care.  .Patient aware per vm and med sent to pharm.

## 2013-05-12 ENCOUNTER — Encounter: Payer: Self-pay | Admitting: Family Medicine

## 2013-05-12 ENCOUNTER — Ambulatory Visit
Admission: RE | Admit: 2013-05-12 | Discharge: 2013-05-12 | Disposition: A | Discharge status: Home / Self Care | Payer: Medicare PPO | Source: Ambulatory Visit | Attending: Family Medicine | Admitting: Family Medicine

## 2013-05-12 ENCOUNTER — Ambulatory Visit (INDEPENDENT_AMBULATORY_CARE_PROVIDER_SITE_OTHER): Payer: 59 | Admitting: Family Medicine

## 2013-05-12 VITALS — BP 136/80 | HR 62 | Temp 97.1°F | Resp 22 | Ht 65.0 in | Wt 195.0 lb

## 2013-05-12 DIAGNOSIS — J209 Acute bronchitis, unspecified: Secondary | ICD-10-CM

## 2013-05-12 MED ORDER — PREDNISONE 20 MG PO TABS: 60.0000 mg | ORAL_TABLET | Freq: Every day | ORAL | Status: DC

## 2013-05-12 MED ORDER — IPRATROPIUM BROMIDE 0.02 % IN SOLN
0.5000 mg | Freq: Once | RESPIRATORY_TRACT | Status: AC
  Administered 2013-05-12: 0.5 mg via RESPIRATORY_TRACT

## 2013-05-12 MED ORDER — LEVOFLOXACIN 750 MG PO TABS: 750.0000 mg | ORAL_TABLET | Freq: Every day | ORAL | Status: DC

## 2013-05-12 MED ORDER — METHYLPREDNISOLONE ACETATE 80 MG/ML IJ SUSP
80.0000 mg | Freq: Once | INTRAMUSCULAR | Status: AC
  Administered 2013-05-12: 80 mg via INTRAMUSCULAR

## 2013-05-12 MED ORDER — METHYLPREDNISOLONE ACETATE 80 MG/ML IJ SUSP: INTRAMUSCULAR | Status: DC

## 2013-05-12 MED ORDER — ALBUTEROL SULFATE (2.5 MG/3ML) 0.083% IN NEBU: 2.5000 mg | INHALATION_SOLUTION | Freq: Four times a day (QID) | RESPIRATORY_TRACT | Status: AC | PRN

## 2013-05-12 MED ORDER — ALBUTEROL SULFATE (5 MG/ML) 0.5% IN NEBU
2.5000 mg | INHALATION_SOLUTION | Freq: Once | RESPIRATORY_TRACT | Status: AC
  Administered 2013-05-12: 2.5 mg via RESPIRATORY_TRACT

## 2013-05-12 NOTE — Addendum Note (Signed)
Addended by: Legrand Rams B on: 05/12/2013 04:29 PM   Modules accepted: Orders

## 2013-05-12 NOTE — Progress Notes (Signed)
Subjective:    Patient ID: Jasmine Fields, female    DOB: 1940-06-18, 72 y.o.   MRN: [id number]  HPI Patient has been sick for 7 weeks.  She's been treated for bronchitis with antibiotics and prednisone. She's been treated for pharyngitis with Keflex through her nose and throat specialist.  She presents today with one-week of worsening shortness of breath, audible wheezing, cough, and subjective fevers. She started a Z-Pak on Friday with no improvement. She is afebrile currently but she is audibly wheezing on exam with decreased breath sounds and prolonged expiratory phase.   Past Medical History  Diagnosis Date  . Hypothyroidism   . Aneurysm   . Hypertension   . Hyperlipidemia   . CHF (congestive heart failure)   . Low back pain   . Osteoarthritis   . IBS (irritable bowel syndrome)     With constipation  . Aneurysm of anterior cerebral artery   . Osteoporosis   . Takotsubo cardiomyopathy    Current Outpatient Prescriptions on File Prior to Visit  Medication Sig Dispense Refill  . albuterol (PROVENTIL HFA;VENTOLIN HFA) 108 (90 BASE) MCG/ACT inhaler Inhale 2 puffs into the lungs every 6 (six) hours as needed for wheezing.  1 Inhaler  0  . amitriptyline (ELAVIL) 10 MG tablet Take 10 mg by mouth at bedtime.        Marland Kitchen aspirin 81 MG tablet Take 81 mg by mouth daily.        Marland Kitchen azithromycin (ZITHROMAX) 250 MG tablet Day 1: Take 2 daily.   Days 2-5: Take 1 daily.  6 tablet  0  . bumetanide (BUMEX) 1 MG tablet TAKE ONE-HALF TABLET BY MOUTH EVERY DAY  15 tablet  5  . carvedilol (COREG) 6.25 MG tablet TAKE ONE TABLET BY MOUTH TWICE DAILY  60 tablet  3  . levothyroxine (SYNTHROID, LEVOTHROID) 100 MCG tablet TAKE ONE TABLET BY MOUTH EVERY DAY  30 tablet  3  . lisinopril (PRINIVIL,ZESTRIL) 40 MG tablet Take 1 tablet (40 mg total) by mouth daily.  30 tablet  3  . lovastatin (MEVACOR) 20 MG tablet TAKE ONE TABLET BY MOUTH AT BEDTIME  30 tablet  0  . predniSONE (DELTASONE) 20 MG tablet Take 1  tablet (20 mg total) by mouth daily with breakfast. Take 2 pills daily for 5 days.  10 tablet  0   No current facility-administered medications on file prior to visit.   Allergies  Allergen Reactions  . Alendronate Sodium     Upset stomach   . Amlodipine     swelling  . Boniva [Ibandronate Sodium]     Upset stomach  . Latex Rash   History   Social History  . Marital Status: Married    Spouse Name: N/A    Number of Children: N/A  . Years of Education: N/A   Occupational History  . Not on file.   Social History Main Topics  . Smoking status: Former Smoker -- 1.50 packs/day for 30 years    Quit date: 05/30/1999  . Smokeless tobacco: Not on file  . Alcohol Use: No  . Drug Use: No  . Sexual Activity: Not on file   Other Topics Concern  . Not on file   Social History Narrative   Married and lives with spouse      Review of Systems  All other systems reviewed and are negative.       Objective:   Physical Exam  Vitals reviewed. Constitutional: She appears well-developed  and well-nourished.  HENT:  Right Ear: External ear normal.  Left Ear: External ear normal.  Nose: Nose normal.  Mouth/Throat: Oropharynx is clear and moist. No oropharyngeal exudate.  Eyes: Conjunctivae are normal.  Neck: Neck supple. No JVD present. No thyromegaly present.  Cardiovascular: Normal rate, regular rhythm and normal heart sounds.   No murmur heard. Pulmonary/Chest: No accessory muscle usage. Not tachypneic. No respiratory distress. She has decreased breath sounds. She has wheezes in the right upper field, the right lower field, the left upper field and the left lower field. She has rhonchi in the right upper field, the right lower field, the left upper field and the left lower field. She has no rales.  Abdominal: Soft. Bowel sounds are normal. She exhibits no distension. There is no tenderness. There is no rebound and no guarding.  Lymphadenopathy:    She has no cervical  adenopathy.          Assessment & Plan:  Acute bronchitis - Plan: methylPREDNISolone acetate (DEPO-MEDROL) 80 MG/ML injection, albuterol (PROVENTIL) (5 MG/ML) 0.5% nebulizer solution 2.5 mg, ipratropium (ATROVENT) nebulizer solution 0.5 mg  Patient has no history of asthma or COPD, but, in the past, she's had bronchitis that was treated like a COPD exacerbation with prolonged prednisone, nebulizer treatments, and antibiotics. At the present time I will start the patient on albuterol 2.5 mg inhaled Q6 hours via the nebulizer machine. I gave the patient Depo-Medrol 80 mg IM x1 now. Prescried prednisone 60 mg by mouth daily for the next 5 days. I will reassess her on Friday, and if her symptoms are improving we will begin gradually wean her off prednisone. Discontinue Zithromax.  Begin Levaquin 750 mg by mouth daily for 7 days. The patient will go for a chest x-ray.  After the patient received a nebulizer treatment in the office her pulse oximetry rose from 89% to 95%.

## 2013-05-13 ENCOUNTER — Telehealth: Payer: Self-pay | Admitting: Family Medicine

## 2013-05-13 MED ORDER — LEVOFLOXACIN 500 MG PO TABS: 500.0000 mg | ORAL_TABLET | Freq: Every day | ORAL | Status: DC

## 2013-05-13 NOTE — Telephone Encounter (Signed)
How much is levaquin 500 qday for 7 days

## 2013-05-13 NOTE — Telephone Encounter (Signed)
Pt is calling is because the Levaquin that was prescribed is to expensive it cost $128 and she can not afford that she would like something else  Pharmacy is [facility] in [place] Call back number is  [phone]

## 2013-05-13 NOTE — Telephone Encounter (Signed)
Called and spoke to [facility] and for Levaquin 500mg  x 7days is going to be $80.37 and per [facility] if pt can not afford that then she is to just finish the zpack. Left message on vm with results of CXR and if she wanted me to call in rx would be gald to do so.

## 2013-05-13 NOTE — Telephone Encounter (Signed)
Pt called back and stated that she could afford the 80 and I sent in rx.

## 2013-05-15 ENCOUNTER — Other Ambulatory Visit: Payer: Self-pay | Admitting: Family Medicine

## 2013-05-16 ENCOUNTER — Ambulatory Visit (INDEPENDENT_AMBULATORY_CARE_PROVIDER_SITE_OTHER): Payer: 59 | Admitting: Family Medicine

## 2013-05-16 ENCOUNTER — Encounter: Payer: Self-pay | Admitting: Family Medicine

## 2013-05-16 VITALS — BP 126/62 | HR 56 | Temp 97.0°F | Resp 20 | Ht 65.0 in | Wt 196.0 lb

## 2013-05-16 DIAGNOSIS — J441 Chronic obstructive pulmonary disease with (acute) exacerbation: Secondary | ICD-10-CM

## 2013-05-16 MED ORDER — PREDNISONE 20 MG PO TABS: ORAL_TABLET | ORAL | Status: DC

## 2013-05-16 NOTE — Progress Notes (Signed)
Subjective:    Patient ID: Jasmine Fields, female    DOB: 1941-01-15, 72 y.o.   MRN: [id number]  HPI 05/12/13 Patient has been sick for 7 weeks.  She's been treated for bronchitis with antibiotics and prednisone. She's been treated for pharyngitis with Keflex through her nose and throat specialist.  She presents today with one-week of worsening shortness of breath, audible wheezing, cough, and subjective fevers. She started a Z-Pak on Friday with no improvement. She is afebrile currently but she is audibly wheezing on exam with decreased breath sounds and prolonged expiratory phase.  At that time, my plan was: Patient has no history of asthma or COPD, but, in the past, she's had bronchitis that was treated like a COPD exacerbation with prolonged prednisone, nebulizer treatments, and antibiotics. At the present time I will start the patient on albuterol 2.5 mg inhaled Q6 hours via the nebulizer machine. I gave the patient Depo-Medrol 80 mg IM x1 now. Prescried prednisone 60 mg by mouth daily for the next 5 days. I will reassess her on Friday, and if her symptoms are improving we will begin gradually wean her off prednisone. Discontinue Zithromax.  Begin Levaquin 750 mg by mouth daily for 7 days. The patient will go for a chest x-ray.  After the patient received a nebulizer treatment in the office her pulse oximetry rose from 89% to 95%.  05/16/13 Patient is here today for follow up.  She is doing much better. She is no longer wheezing. She is no longer short of breath. Her pulse oximetry is 95% on room air. She tried taking prednisone 60 mg by mouth daily. She is crying albuterol nebulizer treatments 2-3 times a day. She denies any pleurisy. She denies any hemoptysis. Past Medical History  Diagnosis Date  . Hypothyroidism   . Aneurysm   . Hypertension   . Hyperlipidemia   . CHF (congestive heart failure)   . Low back pain   . Osteoarthritis   . IBS (irritable bowel syndrome)     With  constipation  . Aneurysm of anterior cerebral artery   . Osteoporosis   . Takotsubo cardiomyopathy    Current Outpatient Prescriptions on File Prior to Visit  Medication Sig Dispense Refill  . albuterol (PROVENTIL HFA;VENTOLIN HFA) 108 (90 BASE) MCG/ACT inhaler Inhale 2 puffs into the lungs every 6 (six) hours as needed for wheezing.  1 Inhaler  0  . albuterol (PROVENTIL) (2.5 MG/3ML) 0.083% nebulizer solution Take 3 mLs (2.5 mg total) by nebulization every 6 (six) hours as needed for wheezing or shortness of breath.  150 mL  1  . amitriptyline (ELAVIL) 10 MG tablet Take 10 mg by mouth at bedtime.        Marland Kitchen aspirin 81 MG tablet Take 81 mg by mouth daily.        Marland Kitchen azithromycin (ZITHROMAX) 250 MG tablet Day 1: Take 2 daily.   Days 2-5: Take 1 daily.  6 tablet  0  . bumetanide (BUMEX) 1 MG tablet TAKE ONE-HALF TABLET BY MOUTH EVERY DAY  15 tablet  5  . carvedilol (COREG) 6.25 MG tablet TAKE ONE TABLET BY MOUTH TWICE DAILY  60 tablet  5  . levofloxacin (LEVAQUIN) 500 MG tablet Take 1 tablet (500 mg total) by mouth daily.  7 tablet  0  . levofloxacin (LEVAQUIN) 750 MG tablet Take 1 tablet (750 mg total) by mouth daily.  7 tablet  0  . levothyroxine (SYNTHROID, LEVOTHROID) 100 MCG tablet TAKE ONE  TABLET BY MOUTH ONCE DAILY  30 tablet  5  . lisinopril (PRINIVIL,ZESTRIL) 40 MG tablet Take 1 tablet (40 mg total) by mouth daily.  30 tablet  3  . lovastatin (MEVACOR) 20 MG tablet TAKE ONE TABLET BY MOUTH AT BEDTIME  30 tablet  5  . methylPREDNISolone acetate (DEPO-MEDROL) 80 MG/ML injection In office  5 mL  0  . predniSONE (DELTASONE) 20 MG tablet Take 1 tablet (20 mg total) by mouth daily with breakfast. Take 2 pills daily for 5 days.  10 tablet  0  . predniSONE (DELTASONE) 20 MG tablet Take 3 tablets (60 mg total) by mouth daily with breakfast.  15 tablet  0   No current facility-administered medications on file prior to visit.   Allergies  Allergen Reactions  . Alendronate Sodium     Upset  stomach   . Amlodipine     swelling  . Boniva [Ibandronate Sodium]     Upset stomach  . Latex Rash   History   Social History  . Marital Status: Married    Spouse Name: N/A    Number of Children: N/A  . Years of Education: N/A   Occupational History  . Not on file.   Social History Main Topics  . Smoking status: Former Smoker -- 1.50 packs/day for 30 years    Quit date: 05/30/1999  . Smokeless tobacco: Not on file  . Alcohol Use: No  . Drug Use: No  . Sexual Activity: Not on file   Other Topics Concern  . Not on file   Social History Narrative   Married and lives with spouse      Review of Systems  All other systems reviewed and are negative.       Objective:   Physical Exam  Vitals reviewed. Constitutional: She appears well-developed and well-nourished.  HENT:  Right Ear: External ear normal.  Left Ear: External ear normal.  Nose: Nose normal.  Mouth/Throat: Oropharynx is clear and moist. No oropharyngeal exudate.  Eyes: Conjunctivae are normal.  Neck: Neck supple. No JVD present. No thyromegaly present.  Cardiovascular: Normal rate, regular rhythm and normal heart sounds.   No murmur heard. Pulmonary/Chest: No accessory muscle usage. Not tachypneic. No respiratory distress. She has no decreased breath sounds. She has no wheezes. She has no rhonchi. She has no rales.  Abdominal: Soft. Bowel sounds are normal. She exhibits no distension. There is no tenderness. There is no rebound and no guarding.  Lymphadenopathy:    She has no cervical adenopathy.          Assessment & Plan:  1. COPD exacerbation I asked the patient to begin weaning down on the prednisone. Start 40 mg by mouth daily for 2 days then 20 mg by mouth daily for 2 days then discontinue prednisone. I asked her to begin the use the albuterol nebulizer treatments more sparingly every 6 hours as needed. I also asked the patient to finish the Levaquin. I gave her samples of Symbicort 160/4.5.  I instructed her to use 2 puffs inhaled twice a day. I think this will help with her dyspnea on exertion and also to prevent future COPD exacerbations. We are to recheck in one month. - predniSONE (DELTASONE) 20 MG tablet; Take 2 tabs daily for 2 day then 1 tab daily for 2 days then quit  Dispense: 6 tablet; Refill: 0

## 2013-06-03 ENCOUNTER — Ambulatory Visit (INDEPENDENT_AMBULATORY_CARE_PROVIDER_SITE_OTHER): Payer: 59 | Admitting: Family Medicine

## 2013-06-03 DIAGNOSIS — Z23 Encounter for immunization: Secondary | ICD-10-CM

## 2013-06-19 ENCOUNTER — Other Ambulatory Visit: Payer: Self-pay | Admitting: Family Medicine

## 2013-06-26 ENCOUNTER — Ambulatory Visit (INDEPENDENT_AMBULATORY_CARE_PROVIDER_SITE_OTHER): Payer: Medicare PPO | Admitting: Cardiovascular Disease

## 2013-06-26 VITALS — BP 151/70 | HR 59 | Ht 65.0 in | Wt 200.0 lb

## 2013-06-26 DIAGNOSIS — I10 Essential (primary) hypertension: Secondary | ICD-10-CM

## 2013-06-26 DIAGNOSIS — I5181 Takotsubo syndrome: Secondary | ICD-10-CM

## 2013-06-26 DIAGNOSIS — E785 Hyperlipidemia, unspecified: Secondary | ICD-10-CM

## 2013-06-26 NOTE — Progress Notes (Signed)
Patient ID: Jasmine Fields, female   DOB: 03/16/1941, 73 y.o.   MRN: [id number]      SUBJECTIVE: The patient is here for routine cardiovascular followup. She has a history of Takotsubo cardiomyopathy, hypertension, and hyperlipidemia, as well as COPD and hypothyroidism. She denies chest pain. She occasionally expressed palpitations when she lies down in bed at night. She denies exertional dyspnea as well as leg swelling. She said her systolic blood pressure is normally 135 at her PCPs office. ECG today shows normal sinus rhythm, heart rate 62 beats per minute.  Allergies  Allergen Reactions  . Alendronate Sodium     Upset stomach   . Amlodipine     swelling  . Boniva [Ibandronate Sodium]     Upset stomach  . Latex Rash    Current Outpatient Prescriptions  Medication Sig Dispense Refill  . albuterol (PROVENTIL HFA;VENTOLIN HFA) 108 (90 BASE) MCG/ACT inhaler Inhale 2 puffs into the lungs every 6 (six) hours as needed for wheezing.  1 Inhaler  0  . albuterol (PROVENTIL) (2.5 MG/3ML) 0.083% nebulizer solution Take 3 mLs (2.5 mg total) by nebulization every 6 (six) hours as needed for wheezing or shortness of breath.  150 mL  1  . amitriptyline (ELAVIL) 10 MG tablet Take 10 mg by mouth at bedtime.        Marland Kitchen. aspirin 81 MG tablet Take 81 mg by mouth daily.        . bumetanide (BUMEX) 1 MG tablet TAKE ONE-HALF TABLET BY MOUTH EVERY DAY  15 tablet  5  . carvedilol (COREG) 6.25 MG tablet TAKE ONE TABLET BY MOUTH TWICE DAILY  60 tablet  5  . levofloxacin (LEVAQUIN) 500 MG tablet Take 1 tablet (500 mg total) by mouth daily.  7 tablet  0  . levothyroxine (SYNTHROID, LEVOTHROID) 100 MCG tablet TAKE ONE TABLET BY MOUTH ONCE DAILY  30 tablet  5  . lisinopril (PRINIVIL,ZESTRIL) 20 MG tablet TAKE TWO TABLETS BY MOUTH ONCE DAILY  60 tablet  11  . lovastatin (MEVACOR) 20 MG tablet TAKE ONE TABLET BY MOUTH AT BEDTIME  30 tablet  5   No current facility-administered medications for this visit.     Past Medical History  Diagnosis Date  . Hypothyroidism   . Aneurysm   . Hypertension   . Hyperlipidemia   . CHF (congestive heart failure)   . Low back pain   . Osteoarthritis   . IBS (irritable bowel syndrome)     With constipation  . Aneurysm of anterior cerebral artery   . Osteoporosis   . Takotsubo cardiomyopathy     Past Surgical History  Procedure Laterality Date  . Cerebral artery coil  2005  . Ectopic pregnancy surgery  1964    Right, SOO  . Cholecystectomy  1960  . Cardiac catheterization  06/2007    Normal arteries    History   Social History  . Marital Status: Married    Spouse Name: N/A    Number of Children: N/A  . Years of Education: N/A   Occupational History  . Not on file.   Social History Main Topics  . Smoking status: Former Smoker -- 1.50 packs/day for 30 years    Quit date: 05/30/1999  . Smokeless tobacco: Not on file  . Alcohol Use: No  . Drug Use: No  . Sexual Activity: Not on file   Other Topics Concern  . Not on file   Social History Narrative   Married and lives  with spouse     Filed Vitals:   06/26/13 1419  BP: 151/70  Pulse: 59  Height: 5\' 5"  (1.651 m)  Weight: 200 lb (90.719 kg)    PHYSICAL EXAM General: NAD Neck: No JVD, no thyromegaly or thyroid nodule.  Lungs: Dry crackles at bases bilaterally with normal respiratory effort. CV: Nondisplaced PMI.  Heart regular S1/S2, no S3/S4, no murmur.  No peripheral edema.  No carotid bruit.  Normal pedal pulses.  Abdomen: Soft, nontender, no hepatosplenomegaly, no distention.  Neurologic: Alert and oriented x 3.  Psych: Normal affect. Extremities: No clubbing or cyanosis.   ECG: reviewed and available in electronic records.      ASSESSMENT AND PLAN: 1. Takotsubo cardiomyopathy: continue carvedilol. Will try and obtain echocardiogram report (performed in 12/2012 as per pt). 2. HTN: uncontrolled. Will monitor for now as she states it is normally well controlled. If  it remains elevated, I would start amlodipine 5 mg daily as I'm unable to increase Coreg given resting HR of 59 bpm, and lisinopril is at maximum effective dose. 3. Hyperlipidemia: In 01/2013, total cholesterol 197, triglycerides 262, HDL 38, LDL 107. Continue lovastatin, although I would consider a more potent statin.  Dispo: f/u 6 months.  Prentice Docker, M.D., F.A.C.C.

## 2013-06-26 NOTE — Patient Instructions (Signed)
Your physician wants you to follow-up in: 6 months  You will receive a reminder letter in the mail two months in advance. If you don't receive a letter, please call our office to schedule the follow-up appointment.  Your physician recommends that you continue on your current medications as directed. Please refer to the Current Medication list given to you today.  

## 2013-07-15 ENCOUNTER — Ambulatory Visit: Payer: Medicare PPO | Admitting: Cardiovascular Disease

## 2013-07-18 ENCOUNTER — Telehealth: Payer: Self-pay | Admitting: Family Medicine

## 2013-07-18 MED ORDER — AZITHROMYCIN 250 MG PO TABS: ORAL_TABLET | ORAL | Status: DC

## 2013-07-18 NOTE — Telephone Encounter (Signed)
Pt is having a dry cough with congestion and runny nose and has been taking Coricidin HBP with no help and would like something else called into pharmacy????

## 2013-07-18 NOTE — Telephone Encounter (Signed)
Med sent to pharm and pt aware 

## 2013-07-18 NOTE — Telephone Encounter (Signed)
Ok with zpack.  NTBS if worse.

## 2013-10-02 ENCOUNTER — Telehealth: Payer: Self-pay | Admitting: *Deleted

## 2013-10-02 MED ORDER — LOVASTATIN 20 MG PO TABS: ORAL_TABLET | ORAL | Status: AC

## 2013-10-02 MED ORDER — LEVOTHYROXINE SODIUM 100 MCG PO TABS: ORAL_TABLET | ORAL | Status: DC

## 2013-10-02 MED ORDER — BUMETANIDE 1 MG PO TABS: ORAL_TABLET | ORAL | Status: AC

## 2013-10-02 MED ORDER — CARVEDILOL 6.25 MG PO TABS: ORAL_TABLET | ORAL | Status: DC

## 2013-10-02 MED ORDER — LISINOPRIL 20 MG PO TABS: ORAL_TABLET | ORAL | Status: DC

## 2013-10-02 MED ORDER — AMITRIPTYLINE HCL 10 MG PO TABS: 10.0000 mg | ORAL_TABLET | Freq: Every day | ORAL | Status: AC

## 2013-10-02 NOTE — Telephone Encounter (Signed)
Message copied by Phillips OdorSIX, Timmothy Baranowski H on Thu Oct 02, 2013 11:26 AM ------      Message from: Malvin JohnsBULLINS, SUSAN S      Created: Thu Oct 02, 2013 10:49 AM       Would like to know if all of her medications have been renewed she said they had ran out       [phone] ------

## 2013-10-09 ENCOUNTER — Ambulatory Visit: Payer: 59 | Admitting: Family Medicine

## 2013-10-13 ENCOUNTER — Ambulatory Visit (INDEPENDENT_AMBULATORY_CARE_PROVIDER_SITE_OTHER): Payer: 59 | Admitting: Physician Assistant

## 2013-10-13 ENCOUNTER — Encounter: Payer: Self-pay | Admitting: Physician Assistant

## 2013-10-13 VITALS — BP 98/68 | HR 64 | Temp 97.6°F | Resp 18 | Wt 196.0 lb

## 2013-10-13 DIAGNOSIS — K5289 Other specified noninfective gastroenteritis and colitis: Secondary | ICD-10-CM

## 2013-10-13 DIAGNOSIS — K529 Noninfective gastroenteritis and colitis, unspecified: Secondary | ICD-10-CM

## 2013-10-13 MED ORDER — PROMETHAZINE HCL 25 MG PO TABS: 25.0000 mg | ORAL_TABLET | Freq: Three times a day (TID) | ORAL | Status: DC | PRN

## 2013-10-13 NOTE — Progress Notes (Signed)
Patient ID: Jasmine Fields MRN: [id number], DOB: 03/19/1941, 73 y.o. Date of Encounter: 10/13/2013, 3:00 PM    Chief Complaint:  Chief Complaint  Patient presents with  . vomiting,diarrhea,chills x 5 days         HPI: 73 y.o. year old white female reports that she's been having some vomiting and diarrhea over the past 5 days. Says that it has gotten much better but she is still feeling nauseous. says yesterday she had no vomiting at all and had very little diarrhea. Says today she has had no diarrhea and no vomiting. However says that she has dranlk very little liquid and really has had nothing to eat. Says that she still feels nauseous and the thought of food makes her feel sick. Has had no focal/localized abdominal pain. No fevers.     Home Meds: See attached medication section for any medications that were entered at today's visit. The computer does not put those onto this list.The following list is a list of meds entered prior to today's visit.   Current Outpatient Prescriptions on File Prior to Visit  Medication Sig Dispense Refill  . albuterol (PROVENTIL HFA;VENTOLIN HFA) 108 (90 BASE) MCG/ACT inhaler Inhale 2 puffs into the lungs every 6 (six) hours as needed for wheezing.  1 Inhaler  0  . albuterol (PROVENTIL) (2.5 MG/3ML) 0.083% nebulizer solution Take 3 mLs (2.5 mg total) by nebulization every 6 (six) hours as needed for wheezing or shortness of breath.  150 mL  1  . amitriptyline (ELAVIL) 10 MG tablet Take 1 tablet (10 mg total) by mouth at bedtime.  30 tablet  5  . aspirin 81 MG tablet Take 81 mg by mouth daily.        . carvedilol (COREG) 6.25 MG tablet TAKE ONE TABLET BY MOUTH TWICE DAILY  60 tablet  5  . levothyroxine (SYNTHROID, LEVOTHROID) 100 MCG tablet TAKE ONE TABLET BY MOUTH ONCE DAILY  30 tablet  5  . lisinopril (PRINIVIL,ZESTRIL) 20 MG tablet TAKE TWO TABLETS BY MOUTH ONCE DAILY  60 tablet  5  . lovastatin (MEVACOR) 20 MG tablet TAKE ONE TABLET BY MOUTH AT  BEDTIME  30 tablet  5  . bumetanide (BUMEX) 1 MG tablet TAKE ONE-HALF TABLET BY MOUTH EVERY DAY  15 tablet  5   No current facility-administered medications on file prior to visit.    Allergies:  Allergies  Allergen Reactions  . Alendronate Sodium     Upset stomach   . Amlodipine     swelling  . Boniva [Ibandronate Sodium]     Upset stomach  . Latex Rash      Review of Systems: See HPI for pertinent ROS. All other ROS negative.    Physical Exam: Blood pressure 98/68, pulse 64, temperature 97.6 F (36.4 C), temperature source Oral, resp. rate 18, weight 196 lb (88.905 kg)., Body mass index is 32.62 kg/(m^2). General: WNWD WF.  Appears in no acute distress. Neck: Supple. No thyromegaly. No lymphadenopathy. Lungs: Clear bilaterally to auscultation without wheezes, rales, or rhonchi. Breathing is unlabored. Heart: Regular rhythm. No murmurs, rubs, or gallops. Abdomen: Soft, non-tender, non-distended with normoactive bowel sounds. No hepatomegaly. No rebound/guarding. No obvious abdominal masses. Msk:  Strength and tone normal for age. Extremities/Skin: Warm and dry. Neuro: Alert and oriented X 3. Moves all extremities spontaneously. Gait is normal. CNII-XII grossly in tact. Psych:  Responds to questions appropriately with a normal affect.     ASSESSMENT AND PLAN:  73  y.o. year old female with  1. Gastroenteritis Will give her some Phenergan to use to help with the nausea. Told her to drink small frequent sips of ginger ale to prevent dehydration. Also recommend eating some plain crackers. Can gradually progress to a bland diet as tolerated. F/U if this does not continue to resolve over the next several days. Also informed her that Phenergan will probably cause her to be drowsy. Do not take prior to driving et [place]. - promethazine (PHENERGAN) 25 MG tablet; Take 1 tablet (25 mg total) by mouth every 8 (eight) hours as needed for nausea or vomiting.  Dispense: 20 tablet; Refill:  0   Signed, [address], [place], [facility] 10/13/2013 3:00 PM

## 2013-12-23 ENCOUNTER — Encounter: Payer: Self-pay | Admitting: Family Medicine

## 2013-12-23 ENCOUNTER — Ambulatory Visit (INDEPENDENT_AMBULATORY_CARE_PROVIDER_SITE_OTHER): Payer: 59 | Admitting: Family Medicine

## 2013-12-23 VITALS — BP 122/86 | HR 72 | Temp 96.4°F | Resp 20 | Ht 65.0 in | Wt 202.0 lb

## 2013-12-23 DIAGNOSIS — M353 Polymyalgia rheumatica: Secondary | ICD-10-CM

## 2013-12-23 NOTE — Progress Notes (Signed)
Subjective:    Patient ID: Jasmine Fields, female    DOB: 12/16/1940, 73 y.o.   MRN: [id number]  HPI  Patient has had diffuse myalgias for over one month.  She saw an orthopedist because she may have cervical spinal stenosis. There scheduling an MRI. She denies any numbness or tingling or weakness in her arms or legs.  She describes aching pains in both arms both wrists both hips both eyes and both lower legs. It occurs on a daily basis. She has tried naproxen without benefit.  She denies any rashes. She denies any tick bite. She denies any fever. She independently stopped lovastatin one month ago without any improvement in her myalgias. Past Medical History  Diagnosis Date  . Hypothyroidism   . Aneurysm   . Hypertension   . Hyperlipidemia   . CHF (congestive heart failure)   . Low back pain   . Osteoarthritis   . IBS (irritable bowel syndrome)     With constipation  . Aneurysm of anterior cerebral artery   . Osteoporosis   . Takotsubo cardiomyopathy    Current Outpatient Prescriptions on File Prior to Visit  Medication Sig Dispense Refill  . albuterol (PROVENTIL HFA;VENTOLIN HFA) 108 (90 BASE) MCG/ACT inhaler Inhale 2 puffs into the lungs every 6 (six) hours as needed for wheezing.  1 Inhaler  0  . albuterol (PROVENTIL) (2.5 MG/3ML) 0.083% nebulizer solution Take 3 mLs (2.5 mg total) by nebulization every 6 (six) hours as needed for wheezing or shortness of breath.  150 mL  1  . amitriptyline (ELAVIL) 10 MG tablet Take 1 tablet (10 mg total) by mouth at bedtime.  30 tablet  5  . aspirin 81 MG tablet Take 81 mg by mouth daily.        . bumetanide (BUMEX) 1 MG tablet TAKE ONE-HALF TABLET BY MOUTH EVERY DAY  15 tablet  5  . carvedilol (COREG) 6.25 MG tablet TAKE ONE TABLET BY MOUTH TWICE DAILY  60 tablet  5  . levothyroxine (SYNTHROID, LEVOTHROID) 100 MCG tablet TAKE ONE TABLET BY MOUTH ONCE DAILY  30 tablet  5  . lisinopril (PRINIVIL,ZESTRIL) 20 MG tablet TAKE TWO TABLETS BY MOUTH  ONCE DAILY  60 tablet  5  . lovastatin (MEVACOR) 20 MG tablet TAKE ONE TABLET BY MOUTH AT BEDTIME  30 tablet  5   No current facility-administered medications on file prior to visit.   Allergies  Allergen Reactions  . Alendronate Sodium     Upset stomach   . Amlodipine     swelling  . Boniva [Ibandronate Sodium]     Upset stomach  . Latex Rash   History   Social History  . Marital Status: Married    Spouse Name: N/A    Number of Children: N/A  . Years of Education: N/A   Occupational History  . Not on file.   Social History Main Topics  . Smoking status: Former Smoker -- 1.50 packs/day for 30 years    Quit date: 05/30/1999  . Smokeless tobacco: Not on file  . Alcohol Use: No  . Drug Use: No  . Sexual Activity: Not on file   Other Topics Concern  . Not on file   Social History Narrative   Married and lives with spouse     Review of Systems  All other systems reviewed and are negative.      Objective:   Physical Exam  Vitals reviewed. Constitutional: She is oriented to person, place,  and time.  Cardiovascular: Normal rate and regular rhythm.   Pulmonary/Chest: Effort normal and breath sounds normal.  Abdominal: Soft. Bowel sounds are normal. She exhibits no distension. There is no tenderness. There is no rebound and no guarding.  Musculoskeletal: Normal range of motion. She exhibits no edema.  Neurological: She is alert and oriented to person, place, and time. She has normal reflexes. She displays normal reflexes. No cranial nerve deficit. She exhibits normal muscle tone. Coordination normal.          Assessment & Plan:  1. Polymyalgia Diagnosis includes fibromyalgia, polymyalgia rheumatica, lupus, autoimmune diseases. Check a sedimentation rate, CK, a CMP to evaluate for elevated alkaline phosphatase or other evidence of bone turnover, check an ANA, and also check a Lyme panel.  His labs are normal, the patient only. Prednisone to treat possible PMR.  His labs are normal and the patient experiences no benefit with prednisone, consider fibromyalgia. - COMPLETE METABOLIC PANEL WITH GFR - CK - Sedimentation rate - B. burgdorfi antibodies by WB - ANA

## 2013-12-24 LAB — CK: CK TOTAL: 49 U/L (ref 7–177)

## 2013-12-24 LAB — COMPLETE METABOLIC PANEL WITH GFR
AST: 12 U/L (ref 0–37)
Albumin: 3.9 g/dL (ref 3.5–5.2)
Alkaline Phosphatase: 66 U/L (ref 39–117)
BUN: 12 mg/dL (ref 6–23)
CALCIUM: 9 mg/dL (ref 8.4–10.5)
CHLORIDE: 102 meq/L (ref 96–112)
CO2: 25 mEq/L (ref 19–32)
CREATININE: 0.69 mg/dL (ref 0.50–1.10)
GFR, Est African American: >89 mL/min
GFR, Est Non African American: 87 mL/min
GLUCOSE: 95 mg/dL (ref 70–99)
Potassium: 4.1 mEq/L (ref 3.5–5.3)
Sodium: 137 mEq/L (ref 135–145)
Total Bilirubin: 0.3 mg/dL (ref 0.2–1.2)
Total Protein: 7.2 g/dL (ref 6.0–8.3)

## 2013-12-24 LAB — B. BURGDORFI ANTIBODIES BY WB
B burgdorferi IgG Abs (IB): NEGATIVE
B burgdorferi IgM Abs (IB): NEGATIVE

## 2013-12-24 LAB — SEDIMENTATION RATE: Sed Rate: 7 mm/hr (ref 0–22)

## 2013-12-24 LAB — ANA: Anti Nuclear Antibody(ANA): NEGATIVE

## 2013-12-25 ENCOUNTER — Other Ambulatory Visit: Payer: Self-pay | Admitting: *Deleted

## 2013-12-25 MED ORDER — PREDNISONE 20 MG PO TABS: ORAL_TABLET | ORAL | Status: DC

## 2014-01-02 ENCOUNTER — Telehealth: Payer: Self-pay | Admitting: Family Medicine

## 2014-01-02 NOTE — Telephone Encounter (Signed)
Patient was scheduled to be seen at 930 on Monday.

## 2014-01-02 NOTE — Telephone Encounter (Signed)
[id number]  PT is calling to let us know that she finished steroids on Wednesday and now she is in double the pain

## 2014-01-02 NOTE — Telephone Encounter (Signed)
Pt still in pain, can she have another round of prednisone?

## 2014-01-05 ENCOUNTER — Ambulatory Visit (INDEPENDENT_AMBULATORY_CARE_PROVIDER_SITE_OTHER): Payer: 59 | Admitting: Family Medicine

## 2014-01-05 ENCOUNTER — Encounter: Payer: Self-pay | Admitting: Family Medicine

## 2014-01-05 VITALS — BP 110/82 | HR 52 | Temp 97.4°F | Resp 18 | Wt 204.0 lb

## 2014-01-05 DIAGNOSIS — E785 Hyperlipidemia, unspecified: Secondary | ICD-10-CM

## 2014-01-05 DIAGNOSIS — M353 Polymyalgia rheumatica: Secondary | ICD-10-CM

## 2014-01-05 LAB — COMPLETE METABOLIC PANEL WITH GFR
ALT: 8 U/L (ref 0–35)
AST: 10 U/L (ref 0–37)
Albumin: 3.8 g/dL (ref 3.5–5.2)
Alkaline Phosphatase: 67 U/L (ref 39–117)
BUN: 9 mg/dL (ref 6–23)
CO2: 26 meq/L (ref 19–32)
Calcium: 9.2 mg/dL (ref 8.4–10.5)
Chloride: 103 mEq/L (ref 96–112)
Creat: 0.81 mg/dL (ref 0.50–1.10)
GFR, EST AFRICAN AMERICAN: 84 mL/min
GFR, Est Non African American: 73 mL/min
GLUCOSE: 93 mg/dL (ref 70–99)
Potassium: 4.9 mEq/L (ref 3.5–5.3)
SODIUM: 136 meq/L (ref 135–145)
TOTAL PROTEIN: 6.7 g/dL (ref 6.0–8.3)
Total Bilirubin: 0.4 mg/dL (ref 0.2–1.2)

## 2014-01-05 LAB — LIPID PANEL
Cholesterol: 263 mg/dL — ABNORMAL HIGH (ref 0–200)
HDL: 36 mg/dL — AB (ref >39)
Total CHOL/HDL Ratio: 7.3 Ratio
Triglycerides: 654 mg/dL — ABNORMAL HIGH (ref <150)

## 2014-01-05 LAB — HEMOGLOBIN A1C
HEMOGLOBIN A1C: 5.8 % — AB (ref <5.7)
Mean Plasma Glucose: 120 mg/dL — ABNORMAL HIGH (ref <117)

## 2014-01-05 MED ORDER — PREDNISONE 20 MG PO TABS: 20.0000 mg | ORAL_TABLET | Freq: Every day | ORAL | Status: DC

## 2014-01-05 NOTE — Progress Notes (Signed)
Subjective:    Patient ID: Jasmine Fields, female    DOB: 1940/07/06, 73 y.o.   MRN: [id number]  HPI  12/23/13 Patient has had diffuse myalgias for over one month.  She saw an orthopedist because she may have cervical spinal stenosis. There scheduling an MRI. She denies any numbness or tingling or weakness in her arms or legs.  She describes aching pains in both arms both wrists both hips both eyes and both lower legs. It occurs on a daily basis. She has tried naproxen without benefit.  She denies any rashes. She denies any tick bite. She denies any fever. She independently stopped lovastatin one month ago without any improvement in her myalgias.  At that time, my plan was: 1. Polymyalgia Diagnosis includes fibromyalgia, polymyalgia rheumatica, lupus, autoimmune diseases. Check a sedimentation rate, CK, a CMP to evaluate for elevated alkaline phosphatase or other evidence of bone turnover, check an ANA, and also check a Lyme panel.  His labs are normal, the patient only. Prednisone to treat possible PMR. His labs are normal and the patient experiences no benefit with prednisone, consider fibromyalgia. - COMPLETE METABOLIC PANEL WITH GFR - CK - Sedimentation rate - B. burgdorfi antibodies by WB - ANA  01/05/14 Patient's pain in her shoulders lower back and hips improved 80-90% on prednisone and return immediately upon discontinuation of the prednisone.  She denies any symptoms of spinal stenosis or cervical spinal stenosis. She denies any neuropathy in the legs Past Medical History  Diagnosis Date  . Hypothyroidism   . Aneurysm   . Hypertension   . Hyperlipidemia   . CHF (congestive heart failure)   . Low back pain   . Osteoarthritis   . IBS (irritable bowel syndrome)     With constipation  . Aneurysm of anterior cerebral artery   . Osteoporosis   . Takotsubo cardiomyopathy    Current Outpatient Prescriptions on File Prior to Visit  Medication Sig Dispense Refill  . albuterol  (PROVENTIL HFA;VENTOLIN HFA) 108 (90 BASE) MCG/ACT inhaler Inhale 2 puffs into the lungs every 6 (six) hours as needed for wheezing.  1 Inhaler  0  . albuterol (PROVENTIL) (2.5 MG/3ML) 0.083% nebulizer solution Take 3 mLs (2.5 mg total) by nebulization every 6 (six) hours as needed for wheezing or shortness of breath.  150 mL  1  . amitriptyline (ELAVIL) 10 MG tablet Take 1 tablet (10 mg total) by mouth at bedtime.  30 tablet  5  . aspirin 81 MG tablet Take 81 mg by mouth daily.        . bumetanide (BUMEX) 1 MG tablet TAKE ONE-HALF TABLET BY MOUTH EVERY DAY  15 tablet  5  . carvedilol (COREG) 6.25 MG tablet TAKE ONE TABLET BY MOUTH TWICE DAILY  60 tablet  5  . levothyroxine (SYNTHROID, LEVOTHROID) 100 MCG tablet TAKE ONE TABLET BY MOUTH ONCE DAILY  30 tablet  5  . lisinopril (PRINIVIL,ZESTRIL) 20 MG tablet TAKE TWO TABLETS BY MOUTH ONCE DAILY  60 tablet  5  . lovastatin (MEVACOR) 20 MG tablet TAKE ONE TABLET BY MOUTH AT BEDTIME  30 tablet  5  . predniSONE (DELTASONE) 20 MG tablet Take (3) tablets by mouth x2 days, then take (2) tablets by mouth x2 days, and then take (1) tablet by mouth x2 days.  12 tablet  0   No current facility-administered medications on file prior to visit.   Allergies  Allergen Reactions  . Alendronate Sodium     Upset  stomach   . Amlodipine     swelling  . Boniva [Ibandronate Sodium]     Upset stomach  . Latex Rash   History   Social History  . Marital Status: Married    Spouse Name: N/A    Number of Children: N/A  . Years of Education: N/A   Occupational History  . Not on file.   Social History Main Topics  . Smoking status: Former Smoker -- 1.50 packs/day for 30 years    Quit date: 05/30/1999  . Smokeless tobacco: Not on file  . Alcohol Use: No  . Drug Use: No  . Sexual Activity: Not on file   Other Topics Concern  . Not on file   Social History Narrative   Married and lives with spouse     Review of Systems  All other systems reviewed and  are negative.      Objective:   Physical Exam  Vitals reviewed. Constitutional: She is oriented to person, place, and time.  Cardiovascular: Normal rate and regular rhythm.   Pulmonary/Chest: Effort normal and breath sounds normal.  Abdominal: Soft. Bowel sounds are normal. She exhibits no distension. There is no tenderness. There is no rebound and no guarding.  Musculoskeletal: Normal range of motion. She exhibits no edema.  Neurological: She is alert and oriented to person, place, and time. She has normal reflexes. No cranial nerve deficit. She exhibits normal muscle tone. Coordination normal.          Assessment & Plan:  1. PMR (polymyalgia rheumatica) Patient symptoms are consistent with polymyalgia rheumatica. I will begin the patient on prednisone 20 mg by mouth daily and recheck in one week. I will taper her down as quickly as possible. - predniSONE (DELTASONE) 20 MG tablet; Take 1 tablet (20 mg total) by mouth daily with breakfast.  Dispense: 10 tablet; Refill: 0  2. HLD (hyperlipidemia) Patient is overdue for lab work. I will check a CMP, fasting lipid and Hga1c. - COMPLETE METABOLIC PANEL WITH GFR - Lipid panel - Hemoglobin A1c

## 2014-01-12 ENCOUNTER — Ambulatory Visit: Payer: 59 | Admitting: Family Medicine

## 2014-01-19 ENCOUNTER — Encounter: Payer: Self-pay | Admitting: Family Medicine

## 2014-01-19 ENCOUNTER — Ambulatory Visit (INDEPENDENT_AMBULATORY_CARE_PROVIDER_SITE_OTHER): Payer: 59 | Admitting: Family Medicine

## 2014-01-19 VITALS — BP 110/64 | HR 69 | Temp 98.1°F | Resp 20 | Ht 65.0 in | Wt 203.0 lb

## 2014-01-19 DIAGNOSIS — M353 Polymyalgia rheumatica: Secondary | ICD-10-CM

## 2014-01-19 NOTE — Progress Notes (Signed)
Subjective:    Patient ID: Jasmine Fields, female    DOB: March 02, 1941, 73 y.o.   MRN: [id number]  HPI  12/23/13 Patient has had diffuse myalgias for over one month.  She saw an orthopedist because she may have cervical spinal stenosis. There scheduling an MRI. She denies any numbness or tingling or weakness in her arms or legs.  She describes aching pains in both arms both wrists both hips both eyes and both lower legs. It occurs on a daily basis. She has tried naproxen without benefit.  She denies any rashes. She denies any tick bite. She denies any fever. She independently stopped lovastatin one month ago without any improvement in her myalgias.  At that time, my plan was: 1. Polymyalgia Diagnosis includes fibromyalgia, polymyalgia rheumatica, lupus, autoimmune diseases. Check a sedimentation rate, CK, a CMP to evaluate for elevated alkaline phosphatase or other evidence of bone turnover, check an ANA, and also check a Lyme panel.  His labs are normal, the patient only. Prednisone to treat possible PMR. His labs are normal and the patient experiences no benefit with prednisone, consider fibromyalgia. - COMPLETE METABOLIC PANEL WITH GFR - CK - Sedimentation rate - B. burgdorfi antibodies by WB - ANA  01/05/14 Patient's pain in her shoulders lower back and hips improved 80-90% on prednisone and return immediately upon discontinuation of the prednisone.  She denies any symptoms of spinal stenosis or cervical spinal stenosis. She denies any neuropathy in the legs.  AT that time, my plan was: 1. PMR (polymyalgia rheumatica) Patient symptoms are consistent with polymyalgia rheumatica. I will begin the patient on prednisone 20 mg by mouth daily and recheck in one week. I will taper her down as quickly as possible. - predniSONE (DELTASONE) 20 MG tablet; Take 1 tablet (20 mg total) by mouth daily with breakfast.  Dispense: 10 tablet; Refill: 0  2. HLD (hyperlipidemia) Patient is overdue for lab  work. I will check a CMP, fasting lipid and Hga1c. - COMPLETE METABOLIC PANEL WITH GFR - Lipid panel - Hemoglobin A1c  01/19/14 The patient did not follow up as planned.  However, her pain subsided 100% on prednisone 20 mg poqday.  She stopped the med abruptly 3 days ago and symptoms have yet to return. Past Medical History  Diagnosis Date  . Hypothyroidism   . Aneurysm   . Hypertension   . Hyperlipidemia   . CHF (congestive heart failure)   . Low back pain   . Osteoarthritis   . IBS (irritable bowel syndrome)     With constipation  . Aneurysm of anterior cerebral artery   . Osteoporosis   . Takotsubo cardiomyopathy    Current Outpatient Prescriptions on File Prior to Visit  Medication Sig Dispense Refill  . albuterol (PROVENTIL HFA;VENTOLIN HFA) 108 (90 BASE) MCG/ACT inhaler Inhale 2 puffs into the lungs every 6 (six) hours as needed for wheezing.  1 Inhaler  0  . albuterol (PROVENTIL) (2.5 MG/3ML) 0.083% nebulizer solution Take 3 mLs (2.5 mg total) by nebulization every 6 (six) hours as needed for wheezing or shortness of breath.  150 mL  1  . amitriptyline (ELAVIL) 10 MG tablet Take 1 tablet (10 mg total) by mouth at bedtime.  30 tablet  5  . aspirin 81 MG tablet Take 81 mg by mouth daily.        . bumetanide (BUMEX) 1 MG tablet TAKE ONE-HALF TABLET BY MOUTH EVERY DAY  15 tablet  5  . carvedilol (COREG) 6.25  MG tablet TAKE ONE TABLET BY MOUTH TWICE DAILY  60 tablet  5  . levothyroxine (SYNTHROID, LEVOTHROID) 100 MCG tablet TAKE ONE TABLET BY MOUTH ONCE DAILY  30 tablet  5  . lisinopril (PRINIVIL,ZESTRIL) 20 MG tablet TAKE TWO TABLETS BY MOUTH ONCE DAILY  60 tablet  5  . lovastatin (MEVACOR) 20 MG tablet TAKE ONE TABLET BY MOUTH AT BEDTIME  30 tablet  5   No current facility-administered medications on file prior to visit.   Allergies  Allergen Reactions  . Alendronate Sodium     Upset stomach   . Amlodipine     swelling  . Boniva [Ibandronate Sodium]     Upset stomach    . Latex Rash   History   Social History  . Marital Status: Married    Spouse Name: N/A    Number of Children: N/A  . Years of Education: N/A   Occupational History  . Not on file.   Social History Main Topics  . Smoking status: Former Smoker -- 1.50 packs/day for 30 years    Quit date: 05/30/1999  . Smokeless tobacco: Not on file  . Alcohol Use: No  . Drug Use: No  . Sexual Activity: Not on file   Other Topics Concern  . Not on file   Social History Narrative   Married and lives with spouse     Review of Systems  All other systems reviewed and are negative.      Objective:   Physical Exam  Vitals reviewed. Constitutional: She is oriented to person, place, and time.  Cardiovascular: Normal rate and regular rhythm.   Pulmonary/Chest: Effort normal and breath sounds normal.  Abdominal: Soft. Bowel sounds are normal. She exhibits no distension. There is no tenderness. There is no rebound and no guarding.  Musculoskeletal: Normal range of motion. She exhibits no edema.  Neurological: She is alert and oriented to person, place, and time. She has normal reflexes. No cranial nerve deficit. She exhibits normal muscle tone. Coordination normal.          Assessment & Plan:  PMR (polymyalgia rheumatica)  Patien elects to take no prednisone at present.  She will call me if symptoms return and we will resume prednisone at 20 mg poqday and wean her down as quickly as possible.

## 2014-01-26 ENCOUNTER — Telehealth: Payer: Self-pay | Admitting: Family Medicine

## 2014-01-26 DIAGNOSIS — Z8679 Personal history of other diseases of the circulatory system: Secondary | ICD-10-CM

## 2014-01-26 NOTE — Telephone Encounter (Signed)
Patient would like ct scan for her aneurysm Please call her back at [phone]

## 2014-01-26 NOTE — Telephone Encounter (Signed)
Called pt and she is wanting to go and have MRI done on her head d/t aneursym years ago, pt is c/o of headaches. ?ok to do referral

## 2014-01-27 NOTE — Telephone Encounter (Signed)
Ok with MRI BUT WHERE ARE HEADACHES LOCATED, MAY BE DUE TO PMR

## 2014-01-27 NOTE — Telephone Encounter (Signed)
Pt states her headaches is mostly in the back and top of head.

## 2014-01-29 NOTE — Telephone Encounter (Signed)
Called pt to inform of the information below and she states that she is on her way to [place] and will not be back so need to do MRI.

## 2014-01-29 NOTE — Telephone Encounter (Signed)
Does not sound like temporal arteritis.  NTBS if worse.

## 2014-01-30 NOTE — Telephone Encounter (Signed)
MRI ordered

## 2014-02-15 ENCOUNTER — Other Ambulatory Visit: Payer: Medicare PPO

## 2014-03-06 ENCOUNTER — Ambulatory Visit (INDEPENDENT_AMBULATORY_CARE_PROVIDER_SITE_OTHER): Payer: Medicare HMO | Admitting: Family

## 2014-03-06 VITALS — BP 119/66 | HR 69 | Temp 98.2°F | Resp 16 | Wt 195.4 lb

## 2014-03-06 DIAGNOSIS — J9801 Acute bronchospasm: Secondary | ICD-10-CM

## 2014-03-06 DIAGNOSIS — J40 Bronchitis, not specified as acute or chronic: Secondary | ICD-10-CM

## 2014-03-06 MED ORDER — AEROCHAMBER MV MISC
Freq: Every day | Status: AC
Start: 2014-03-06 — End: ?

## 2014-03-06 MED ORDER — IPRATROPIUM BROMIDE 0.02 % IN SOLN
0.5000 mg | Freq: Once | RESPIRATORY_TRACT | Status: AC
Start: 2014-03-06 — End: 2014-03-06
  Administered 2014-03-06: 0.5 mg via RESPIRATORY_TRACT

## 2014-03-06 MED ORDER — PREDNISONE 20 MG OR TABS
40.0000 mg | ORAL_TABLET | Freq: Every day | ORAL | Status: AC
Start: 2014-03-06 — End: 2014-03-10

## 2014-03-06 MED ORDER — AZITHROMYCIN 250 MG OR TABS
ORAL_TABLET | ORAL | Status: AC
Start: 2014-03-06 — End: ?

## 2014-03-06 MED ORDER — ALBUTEROL SULFATE HFA 108 (90 BASE) MCG/ACT IN AERS
2.0000 | INHALATION_SPRAY | RESPIRATORY_TRACT | Status: DC | PRN
Start: 2014-03-06 — End: 2014-03-11

## 2014-03-06 MED ORDER — ALBUTEROL SULFATE (2.5 MG/3ML) 0.083% IN NEBU
2.5000 mg | INHALATION_SOLUTION | Freq: Once | RESPIRATORY_TRACT | Status: AC
Start: 2014-03-06 — End: 2014-03-06
  Administered 2014-03-06: 2.5 mg via RESPIRATORY_TRACT

## 2014-03-06 NOTE — Progress Notes (Signed)
Pt given Duoneb nebulizer treatment in the office. Patient tolerated well.  Leitha BleakHeather Coates, CMA

## 2014-03-06 NOTE — Patient Instructions (Addendum)
Use inhaler 2 puffs with spacer every 4-6 hours for cough and chest tightness  Start steroids today  Only once day for 4 days.  Increase moisture and steam, shower twice day  Avoid all smoking, have family members smoke outside..  Follow up with PCP if not improved in 7 days    Bronchospasm (Adult)    Bronchospasm occurs when the airways (bronchial tubes) go into spasm and contract. This makes it hard to breathe and causes wheezing (a high-pitched whistling sound). Bronchospasm can also cause frequent coughing without the wheezing sound.  Bronchospasm is due to irritation, inflammation or allergic reaction of the airways. People with asthma get bronchospasm. However, not everyone with bronchospasm has asthma.  Being exposed to harmful fumes, a recent case of bronchitis, or a flare-up of chronic emphysema (COPD) may cause the airways to spasm. An episode of bronchospasm may last 7-14 days. Medicine may be prescribed to relax the airways and prevent wheezing. Antibiotics will be prescribed only if your doctor thinks there is a bacterial infection. Antibiotics do not help a viral infection.  Home Care:  1. Drink lots of water or other fluids (at least 10 glasses a day) during an attack. This will loosen lung secretions and make it easier to breathe. If you have heart or kidney disease, check with your doctor before you drink extra amounts of fluids.  2. Take prescribed medicine exactly at the times advised. If you have a hand-held inhaler or aerosol breathing medicine, do not use it more than once every four hours, unless told to do so. If prescribed an antibiotic or prednisone, take all of the medicine even if you are feeling better after a few days.  3. Do not smoke. Avoid being exposed to the smoke of others.  4. If you were given an inhaler, use it exactly as directed. If you need to use it more often than prescribed, your condition may be getting worse. Contact your doctor or this facility.  Follow Up With Your  Doctor, Or As Directed.  [ NOTE: If you are age 72 or older, or if you have chronic asthma or COPD, we recommend a PNEUMOCOCCAL VACCINATION every five years and a yearly INFLUENZA VACCINATION (FLU-SHOT) every autumn. Ask your doctor about this.]  Get Prompt Medical Attention If Any Of The Following Occur:   Increased wheezing or shortness of breath   Need to use your inhalers more often than usual without relief   Fever of 100.14F (38C) or higher, or as directed by your healthcare provider   Coughing up lots of dark-colored or bloody sputum (mucus)   Chest pain with each breath   You do not start to improve within 24 hours   2000-2015 [employer] [address], [place], [place] [id number]. All rights reserved. This information is not intended as a substitute for professional medical care. Always follow your healthcare professional's instructions.

## 2014-03-06 NOTE — Addendum Note (Signed)
Addended by: Dicie BeamLEE, [facility] on: 03/06/2014 03:38 PM     Modules accepted: Level of Service

## 2014-03-06 NOTE — Progress Notes (Signed)
SUBJECTIVE:  Sonia Richmond is a 73 year old  female  Here for URI      HPI:   denied fevers, chills, body aches  Ear: denied ear pain or pressure  EYES: no itchy or watery eyes  Nose: rhinorrhea and congested for few days  ST: mild raw at times,   Cough: little production, mostly harsh NP  Skin: no rashes or changes   No smoking, but living with smokers lives with son and his wife   No allergies or seasonal allergies. No asthma history     I personally reviewed and confirmed the ROS, past medical history, past surgical history, family history and social history in the record with the patient today.    Review of patient's allergies indicates:  Allergies   Allergen Reactions   . Latex      There is no problem list on file for this patient.    No past medical history on file.  History   Substance Use Topics   . Smoking status: Not on file   . Smokeless tobacco: Not on file   . Alcohol Use: Not on file     No outpatient prescriptions prior to visit.     No facility-administered medications prior to visit.       ROS:   Constitutional: Negative    Eyes: Negative    Ears, Nose, Mouth, Throat: As noted in HPI above   Cardiovascular:  HTN and cholesterol    Respiratory: Negative  For asthma or COPD   Gastrointestinal: Positive for nausea for few nights.   Genitourinary: Negative   Musculoskeletal: Positive for polymyalgia diagnosis.   Skin: Negative    Neurological: Negative    Psychiatric: Negative    Endocrine:   Synthroid for thyroid    Hematologic/Lymphatic: Negative   Allergic/Immunologic: Negative     OBJECTIVE:  Alert female in no acute distress  Blood pressure 119/66, pulse 69, temperature 98.2 F (36.8 C), temperature source Temporal, resp. rate 16, weight 195 lb 6.4 oz (88.633 kg), SpO2 96 %.  PHYSICAL EXAM:  General: healthy, alert, mild distress  Skin: Skin color, texture, turgor normal. No rashes or concerning lesions  Head: Normocephalic. No masses, lesions, tenderness or abnormalities  Eyes:  Lids/periorbital skin normal, Conjunctivae/corneas clear, PERRL, EOM's intact  Ears: External ears normal. Canals clear. TM's normal.  Nose:normal  Oropharynx: Lips, mucosa, and tongue normal. Teeth and gums normal., posterior pharynx without erythema or drainage  Neck: supple. No adenopathy. Thyroid symmetric, normal size, without nodules  Lungs: expiratory wheezes  Heart: normal rate, regular rhythm and no murmurs, clicks, or gallops      ASSESSMENT:   Bronchospasm with bronchitis  Smoking exposure      PLAN:  Patient was given nebulizer treatment in office, see orders administered by medical assistant  Post evaluation showed wheezing continued bilateral bases,  Patient educated on wheezing, due to irritation more likely from smoking exposure from family members  Patient educated on use of Cipro air with spacer 2 puffs every 4-6 hours for cough, chest tightness, wheezing  Use short course steroids only once a day for 4 days  Since patient lives with 2 family members who smoke heavily, we'll cover her with Rx,   Patient educated on increasing steam, moisture,  Follow-up if not showing signs of improvement in the next 3-4 days  Follow-up with PCP to establish care if she decides to move here permanently

## 2014-03-11 ENCOUNTER — Ambulatory Visit (INDEPENDENT_AMBULATORY_CARE_PROVIDER_SITE_OTHER): Payer: Medicare HMO | Admitting: Family Medicine

## 2014-03-11 ENCOUNTER — Encounter (INDEPENDENT_AMBULATORY_CARE_PROVIDER_SITE_OTHER): Payer: Self-pay | Admitting: Family Medicine

## 2014-03-11 VITALS — BP 139/76 | HR 64 | Temp 98.3°F | Resp 16 | Ht 63.75 in | Wt 192.4 lb

## 2014-03-11 DIAGNOSIS — R059 Cough, unspecified: Secondary | ICD-10-CM

## 2014-03-11 DIAGNOSIS — Z720 Tobacco use: Secondary | ICD-10-CM

## 2014-03-11 DIAGNOSIS — I10 Essential (primary) hypertension: Secondary | ICD-10-CM

## 2014-03-11 DIAGNOSIS — I671 Cerebral aneurysm, nonruptured: Secondary | ICD-10-CM

## 2014-03-11 DIAGNOSIS — E785 Hyperlipidemia, unspecified: Secondary | ICD-10-CM

## 2014-03-11 DIAGNOSIS — R05 Cough: Secondary | ICD-10-CM

## 2014-03-11 DIAGNOSIS — J4521 Mild intermittent asthma with (acute) exacerbation: Secondary | ICD-10-CM

## 2014-03-11 DIAGNOSIS — I5181 Takotsubo syndrome: Secondary | ICD-10-CM | POA: Insufficient documentation

## 2014-03-11 DIAGNOSIS — E039 Hypothyroidism, unspecified: Secondary | ICD-10-CM

## 2014-03-11 DIAGNOSIS — M353 Polymyalgia rheumatica: Secondary | ICD-10-CM | POA: Insufficient documentation

## 2014-03-11 DIAGNOSIS — Z87891 Personal history of nicotine dependence: Secondary | ICD-10-CM

## 2014-03-11 LAB — BASIC METABOLIC PANEL
Anion Gap: 5 (ref 4–12)
Calcium: 9.9 mg/dL (ref 8.9–10.2)
Carbon Dioxide, Total: 31 mEq/L (ref 22–32)
Chloride: 102 mEq/L (ref 98–108)
Creatinine: 0.89 mg/dL (ref 0.38–1.02)
GFR, Calc, African American: >60 mL/min (ref >59)
GFR, Calc, European American: >60 mL/min (ref >59)
Glucose: 79 mg/dL (ref 62–125)
Potassium: 4.9 mEq/L (ref 3.6–5.2)
Sodium: 138 mEq/L (ref 135–145)
Urea Nitrogen: 15 mg/dL (ref 8–21)

## 2014-03-11 LAB — LIPID PANEL
Cholesterol (LDL): 61 mg/dL (ref <130)
Cholesterol/HDL Ratio: 4.4
HDL Cholesterol: 37 mg/dL — ABNORMAL LOW (ref >39)
Non-HDL Cholesterol: 127 mg/dL (ref 0–159)
Total Cholesterol: 164 mg/dL (ref <200)
Triglyceride: 331 mg/dL — ABNORMAL HIGH (ref <150)

## 2014-03-11 LAB — THYROID STIMULATING HORMONE: Thyroid Stimulating Hormone: 0.986 u[IU]/mL (ref 0.400–5.000)

## 2014-03-11 LAB — DIRECT LOW DENSITY LIPOPROTEIN: Direct Low Density Lipoprotein: 95 mg/dL (ref <130)

## 2014-03-11 NOTE — Patient Instructions (Signed)
Thank you for coming in today Sonia Richmond.    1. Cough/ Smoking history  You likely have reactive airway disease, which could be the beginnings of COPD given your smoking history. Its good that you are improving.   - please use the symbicort to help the cough  - if you worsen, please return.   - it would help if your roommates smoked outside only.     2. Hypothyroidism, unspecified hypothyroidism type  Will recheck your lab tests  - THYROID STIMULATING HORMONE    4. Essential hypertension  Your blood pressure is good today, goal is below 140/90.   - continue lisinopril.   - BASIC METABOLIC PANEL    5. Hyperlipidaemia  Will recheck.   - LIPID PANEL  - DIRECT LOW DENSITY LIPOPROTEIN    6. Cerebral aneurysm  Due for follow up imagine and to establish care.   - REFERRAL TO NEUROLOGY     Sonia HeadingsAmy Kenzly Rogoff, MD

## 2014-03-11 NOTE — Progress Notes (Signed)
Subjective:    Sonia Richmond is a 73 year old female who presents today for the following concerns:    1)Cough: Started 6 days ago, [facility] it [facility] and now is following up. She has a lot of coughing, slightly productive - yellowish green.  She feels well otherwise, still works. Has wheezing, especially at night. She had rhinorrhea and sore throat at first, not anymore.      She lives with her Son and his wife and they both smoke.    [facility]. She started the prednisone and azithromycin antibiotic, but didn't pick up the inhaler because it was too expensive.  The medications just helped slightly - not that much. Overall, she feels that she is better slightly.      No shortness of breath, no fevers, no chest pain.     She has symbicort that she uses when needed, but hasn't been using it recently.  She uses it if she has trouble breathing with a cold.     Rx'd that about 4 months ago [place].     35 years of smoke about 2.5 packs a day. Lives with smokers who smoke indoors.  They will not go outside to smoke.     There is no problem list on file for this patient.    Medications:  Current outpatient prescriptions: Acetaminophen 325 MG Oral Tab, Take 325 mg by mouth daily as needed., Disp: , Rfl: ;  Amitriptyline HCl 100 MG Oral Tab, Take 100 mg by mouth at bedtime as needed. For insomnia, Disp: , Rfl: ;  Aspirin (ASPIR-81) 81 MG Oral Tab EC, Take 81 mg by mouth daily., Disp: , Rfl: ;  Azithromycin 250 MG Oral Tab, Take 2 tablets today, then take 1 tablet every day until gone., Disp: 6 tablet, Rfl: 0  Budesonide-Formoterol Fumarate (SYMBICORT) 160-4.5 MCG/ACT Inhalation Aerosol, Inhale 2 puffs by mouth., Disp: , Rfl: ;  BUMETANIDE OR, , Disp: , Rfl: ;  CARVEDILOL OR, Take 6.25 mg by mouth 2 times a day. , Disp: , Rfl: ;  Levothyroxine Sodium 100 MCG Oral Cap, Take 100 mcg by mouth daily on an empty stomach., Disp: , Rfl: ;  LISINOPRIL OR, Take  20 mg by mouth 2 times a day. , Disp: , Rfl:   LOVASTATIN OR, Take 20 mg by mouth daily. , Disp: , Rfl: ;  Spacer/Aero-Holding Chambers (AEROCHAMBER MV) Misc, Use daily., Disp: 1 each, Rfl: 0      Objective:  BP 139/76 mmHg  Pulse 64  Temp(Src) 98.3 F (36.8 C) (Temporal)  Resp 16  Ht 5' 3.75" (1.619 m)  Wt 192 lb 6.4 oz (87.272 kg)  BMI 33.30 kg/m2  SpO2 95%   Body mass index is 33.3 kg/(m^2).  General: healthy, alert, no distress  Skin: Skin color, texture, turgor normal. No rashes or concerning lesions  Lymphatic:  Cervical nodes:  no adenopathy,   Head: Normocephalic. No masses, lesions, tenderness or abnormalities  Eyes: Lids/periorbital skin normal, Conjunctivae/corneas clear, PERRL, EOM's intact  Ears: External ears normal. Canals clear. TM's normal.  Nose:nares patent, clear secretions in nose  Oropharynx: Lips, mucosa, and tongue normal. Dentures in place., posterior pharynx without erythema or drainage  Neck: supple. No adenopathy. Thyroid symmetric, normal size, without nodules  Lungs: expiratory wheezes in all lung fields, no crackles or rhonchi  Heart: normal rate, regular rhythm and no murmurs, clicks, or gallops  Neurological exam reveals Alert and oriented.  Speech normal and Gait and station normal    Assessment and Plan:    1. Cough/ Smoking history  You likely have reactive airway disease, which could be the beginnings of COPD given your smoking history. Its good that you are improving.   - please use the symbicort to help the cough  - if you worsen, please return.   - it would help if your roommates smoked outside only.     2. Hypothyroidism, unspecified hypothyroidism type  Will recheck your lab tests  - THYROID STIMULATING HORMONE    4. Essential hypertension  Your blood pressure is good today, goal is below 140/90.   - continue lisinopril.   - BASIC METABOLIC PANEL    5. Hyperlipidaemia  Will recheck.   - LIPID PANEL  - DIRECT LOW DENSITY LIPOPROTEIN    6. Cerebral aneurysm  Due for  follow up imagine and to establish care.   - REFERRAL TO NEUROLOGY     Madelin HeadingsAmy Ioan Landini, MD

## 2014-03-18 ENCOUNTER — Encounter (INDEPENDENT_AMBULATORY_CARE_PROVIDER_SITE_OTHER): Payer: Self-pay | Admitting: Family Medicine

## 2014-03-19 ENCOUNTER — Telehealth (INDEPENDENT_AMBULATORY_CARE_PROVIDER_SITE_OTHER): Payer: Self-pay | Admitting: Family Medicine

## 2014-03-19 NOTE — Telephone Encounter (Signed)
CONFIRMED PHONE NUMBER: [phone]  CALLERS FIRST AND LAST NAME: [name]  FACILITY NAME: n/a TITLE: n/a  CALLERS RELATIONSHIP:Self  RETURN CALL: Detailed message on voicemail only    SUBJECT: General Message   REASON FOR REQUEST: Change referral location    MESSAGE: Patient is requesting we change her referral to neurology to somewhere closer to the [place] area. Routing to provider to advise.

## 2014-03-20 NOTE — Telephone Encounter (Signed)
On-line referral done and EPIC referral faxed to [facility] (closest contracted with her insurance).    Notified patient and gave her scheduling phone number.

## 2014-03-20 NOTE — Telephone Encounter (Signed)
Routing for aid from our referral coordinator.  Do you know of any neurosurgery services available closer [place] [facility] [facility] [place] then the [facility] (It looks like the referral location was changed a couple of times, but patient states that [facility] cannot [facility]).   Thank you,  Madelin HeadingsAmy Raequan Vanschaick, MD

## 2014-04-10 ENCOUNTER — Ambulatory Visit: Payer: 59 | Admitting: Family Medicine

## 2014-04-10 ENCOUNTER — Ambulatory Visit (INDEPENDENT_AMBULATORY_CARE_PROVIDER_SITE_OTHER): Payer: MEDICARE | Admitting: *Deleted

## 2014-04-10 DIAGNOSIS — Z23 Encounter for immunization: Secondary | ICD-10-CM

## 2014-04-25 ENCOUNTER — Other Ambulatory Visit: Payer: Self-pay | Admitting: Family Medicine

## 2014-04-30 ENCOUNTER — Encounter: Payer: Self-pay | Admitting: Family Medicine

## 2014-04-30 ENCOUNTER — Telehealth: Payer: Self-pay | Admitting: *Deleted

## 2014-04-30 ENCOUNTER — Ambulatory Visit (INDEPENDENT_AMBULATORY_CARE_PROVIDER_SITE_OTHER): Payer: Medicare Other | Admitting: Family Medicine

## 2014-04-30 VITALS — BP 132/76 | HR 76 | Temp 97.9°F | Resp 20 | Ht 65.0 in | Wt 194.0 lb

## 2014-04-30 DIAGNOSIS — M79629 Pain in unspecified upper arm: Secondary | ICD-10-CM

## 2014-04-30 DIAGNOSIS — I10 Essential (primary) hypertension: Secondary | ICD-10-CM

## 2014-04-30 DIAGNOSIS — E785 Hyperlipidemia, unspecified: Secondary | ICD-10-CM

## 2014-04-30 DIAGNOSIS — M25529 Pain in unspecified elbow: Secondary | ICD-10-CM

## 2014-04-30 DIAGNOSIS — M353 Polymyalgia rheumatica: Secondary | ICD-10-CM | POA: Diagnosis not present

## 2014-04-30 MED ORDER — PREDNISONE 10 MG PO TABS: 10.0000 mg | ORAL_TABLET | Freq: Every day | ORAL | Status: DC

## 2014-04-30 NOTE — Telephone Encounter (Signed)
Received call from patient.   Reports that MD gave recommendation for Psychiatrist in [place]. States that recommended psychiatrist is not in network [facility].  Requested new recommendation.   MD please advise.

## 2014-04-30 NOTE — Progress Notes (Signed)
Subjective:    Patient ID: Jasmine Fields, female    DOB: 02/18/1941, 73 y.o.   MRN: [id number]  HPI  I last saw the patient in August. At that time I diagnosed her with PMR and started her on prednisone. Taking prednisone the pain in her shoulders and in the hips and upper legs completely resolved. She remained pain-free as long she took the prednisone. However the she then abruptly discontinued the medication without my consultation. The pain has returned in her shoulders and her hips and in her upper legs. She also complains of pain in both hands. The pain originates at her wrist and radiates up into her forearm and down into her fingertips. The pain is worse at night and seems to be position related. She denies any injury to her neck. This seems to be unrelated to her PMR. She also has hypertension. Her blood pressures well controlled today at 132/76. She denies any chest pain shortness of breath or dyspnea on exertion. She brought some labs from another facility that she had done one month ago. Her LDL cholesterol was well below 100. Her creatinine and liver function tests were completely normal. Her CBC was completely normal. She denies any right upper quadrant pain on her statin medication. She has previously tried discontinuing the statin medication without any improvement in her myalgias. She has currently moved back home with her husband. She had left him for 3 months earlier this year. She is now requesting referral to a psychologist for counseling Past Medical History  Diagnosis Date  . Hypothyroidism   . Aneurysm   . Hypertension   . Hyperlipidemia   . CHF (congestive heart failure)   . Low back pain   . Osteoarthritis   . IBS (irritable bowel syndrome)     With constipation  . Aneurysm of anterior cerebral artery   . Osteoporosis   . Takotsubo cardiomyopathy    Past Surgical History  Procedure Laterality Date  . Cerebral artery coil  2005  . Ectopic pregnancy surgery   1964    Right, SOO  . Cholecystectomy  1960  . Cardiac catheterization  06/2007    Normal arteries   Current Outpatient Prescriptions on File Prior to Visit  Medication Sig Dispense Refill  . albuterol (PROVENTIL HFA;VENTOLIN HFA) 108 (90 BASE) MCG/ACT inhaler Inhale 2 puffs into the lungs every 6 (six) hours as needed for wheezing. 1 Inhaler 0  . albuterol (PROVENTIL) (2.5 MG/3ML) 0.083% nebulizer solution Take 3 mLs (2.5 mg total) by nebulization every 6 (six) hours as needed for wheezing or shortness of breath. 150 mL 1  . amitriptyline (ELAVIL) 10 MG tablet Take 1 tablet (10 mg total) by mouth at bedtime. 30 tablet 5  . aspirin 81 MG tablet Take 81 mg by mouth daily.      . bumetanide (BUMEX) 1 MG tablet TAKE ONE-HALF TABLET BY MOUTH EVERY DAY 15 tablet 5  . carvedilol (COREG) 6.25 MG tablet TAKE ONE TABLET BY MOUTH TWICE DAILY 60 tablet 5  . levothyroxine (SYNTHROID, LEVOTHROID) 100 MCG tablet TAKE ONE TABLET BY MOUTH ONCE DAILY 30 tablet 1  . lisinopril (PRINIVIL,ZESTRIL) 20 MG tablet TAKE TWO TABLETS BY MOUTH ONCE DAILY 60 tablet 5  . lovastatin (MEVACOR) 20 MG tablet TAKE ONE TABLET BY MOUTH AT BEDTIME 30 tablet 5   No current facility-administered medications on file prior to visit.   Allergies  Allergen Reactions  . Alendronate Sodium     Upset stomach   .  Amlodipine     swelling  . Boniva [Ibandronate Sodium]     Upset stomach  . Latex Rash   History   Social History  . Marital Status: Married    Spouse Name: N/A    Number of Children: N/A  . Years of Education: N/A   Occupational History  . Not on file.   Social History Main Topics  . Smoking status: Former Smoker -- 1.50 packs/day for 30 years    Quit date: 05/30/1999  . Smokeless tobacco: Not on file  . Alcohol Use: No  . Drug Use: No  . Sexual Activity: Not on file   Other Topics Concern  . Not on file   Social History Narrative   Married and lives with spouse     Review of Systems  All other  systems reviewed and are negative.      Objective:   Physical Exam  Constitutional: She is oriented to person, place, and time.  Cardiovascular: Normal rate, regular rhythm, normal heart sounds and intact distal pulses.   No murmur heard. Pulmonary/Chest: Effort normal and breath sounds normal. No respiratory distress. She has no wheezes. She has no rales. She exhibits no tenderness.  Abdominal: Soft. Bowel sounds are normal. She exhibits no distension and no mass. There is no tenderness. There is no rebound and no guarding.  Musculoskeletal: She exhibits tenderness. She exhibits no edema.  Neurological: She is alert and oriented to person, place, and time. She has normal reflexes. She displays normal reflexes. No cranial nerve deficit. She exhibits normal muscle tone. Coordination normal.  Vitals reviewed.         Assessment & Plan:  PMR (polymyalgia rheumatica) - Plan: predniSONE (DELTASONE) 10 MG tablet  HLD (hyperlipidemia)  Essential hypertension  Pain in joint, upper arm, unspecified laterality  I would like the patient to begin prednisone 10 mg by mouth daily and recheck with us in 2 weeks. If her pain in her shoulders and in her hips has improved I will start wean her down by 1 mg every 1-2 weeks until she is completely off medication. I do not believe the pain in her arms is related to PMR. I believe it may be carpal tunnel syndrome of some sort of cervical radiculopathy. Therefore in 2 weeks if the pain has persisted I recommended conduction studies/EMGs of the upper extremities to further diagnose the pain in her arms. If she has carpal tunnel syndrome, she would benefit from cortisone injections at the wrist. Her blood pressures well controlled today. She does endorse symptoms of orthostatic dizziness at home. This seems to be getting more severe and lasting minutes at a time. She denies any true syncope. I recommended that she reduce her lisinopril and carvedilol by 50%.  Recheck in 2 weeks. Patient's cholesterol is well controlled. I'll make no changes in her statin medication at this time.

## 2014-05-01 NOTE — Telephone Encounter (Signed)
Call placed to patient and patient made aware.  

## 2014-05-01 NOTE — Telephone Encounter (Signed)
Have patient call and get list of providers in her network and I will tell her who I recommend from her list.

## 2014-05-13 ENCOUNTER — Telehealth: Payer: Self-pay | Admitting: Family Medicine

## 2014-05-13 NOTE — Telephone Encounter (Signed)
Patient is calling to let dr pickard know that the prednisone took her pain away [phone] with any questions

## 2014-05-14 MED ORDER — PREDNISONE 5 MG PO TABS: 7.5000 mg | ORAL_TABLET | Freq: Every day | ORAL | Status: DC

## 2014-05-14 NOTE — Telephone Encounter (Signed)
LMTRC

## 2014-05-14 NOTE — Telephone Encounter (Signed)
PT has returned your call you can call her back at [phone]

## 2014-05-14 NOTE — Telephone Encounter (Signed)
She needs to wean down slowly.  Decrease to 7.5 mg poqday x 2 weeks and then call us back and let us know how she is doing.  DO NOT ABRUPTLY STOP>

## 2014-05-14 NOTE — Telephone Encounter (Signed)
Pt aware via vm and med sent to pharm 

## 2014-05-15 ENCOUNTER — Other Ambulatory Visit: Payer: Self-pay | Admitting: Family Medicine

## 2014-06-03 ENCOUNTER — Encounter (HOSPITAL_COMMUNITY): Payer: Self-pay | Admitting: Psychiatry

## 2014-06-03 ENCOUNTER — Ambulatory Visit (INDEPENDENT_AMBULATORY_CARE_PROVIDER_SITE_OTHER): Payer: Medicare Other | Admitting: Psychiatry

## 2014-06-03 ENCOUNTER — Telehealth: Payer: Self-pay | Admitting: Family Medicine

## 2014-06-03 DIAGNOSIS — F411 Generalized anxiety disorder: Secondary | ICD-10-CM

## 2014-06-03 DIAGNOSIS — F32A Depression, unspecified: Secondary | ICD-10-CM

## 2014-06-03 DIAGNOSIS — F329 Major depressive disorder, single episode, unspecified: Secondary | ICD-10-CM

## 2014-06-03 NOTE — Telephone Encounter (Signed)
PT called back and said that when she was on the medication the pain was gone but since off the pain is back in multiple locations
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
## 2014-06-03 NOTE — Patient Instructions (Signed)
Discussed orally 

## 2014-06-03 NOTE — Telephone Encounter (Signed)
[phone] PT is done with prednisone for 3 days now and some of the pain is still here

## 2014-06-04 ENCOUNTER — Ambulatory Visit (HOSPITAL_COMMUNITY): Payer: Medicare PPO | Admitting: Psychology

## 2014-06-04 MED ORDER — PREDNISONE 5 MG PO TABS: 7.5000 mg | ORAL_TABLET | Freq: Every day | ORAL | Status: DC

## 2014-06-04 NOTE — Telephone Encounter (Signed)
She is not supposed to stop the prednisone, she has to wean off the med gradually.  She is supposed to call us as the med is ending to wean down further depending on how she is doing.  Resume prednisone but at 7.5 mg poqday for 2 weeks and then CALL US for NEXT DOSE.

## 2014-06-04 NOTE — Telephone Encounter (Signed)
Prednisone sent to pharm

## 2014-06-04 NOTE — Telephone Encounter (Signed)
Patient aware and will call us back before this rx runs out.

## 2014-06-11 IMAGING — CT CT NECK W/ CM
4 of 5 series · 15 of 33 positions shown, 18 images · IV contrast (APPLIED)
Comparison: 10/17/2011

CLINICAL DATA: Neck pain and swelling.

CT NECK WITH CONTRAST
TECHNIQUE: Multidetector CT imaging of the neck was performed with
intravenous contrast.
Contrast: 75mL OMNIPAQUE IOHEXOL 300 MG/ML  SOLN

[Series 3: st neck 2.0 b31s · axial · 0.46mm/px · z∈[-225,-165]mm · 2 of 122 slices shown]
[im 31/122  bone]
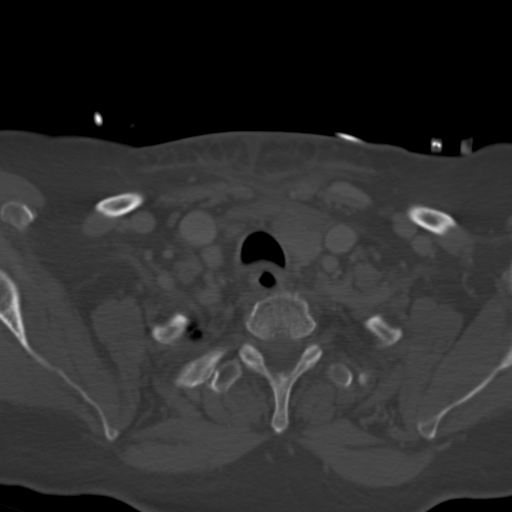
[im 61/122  bone]
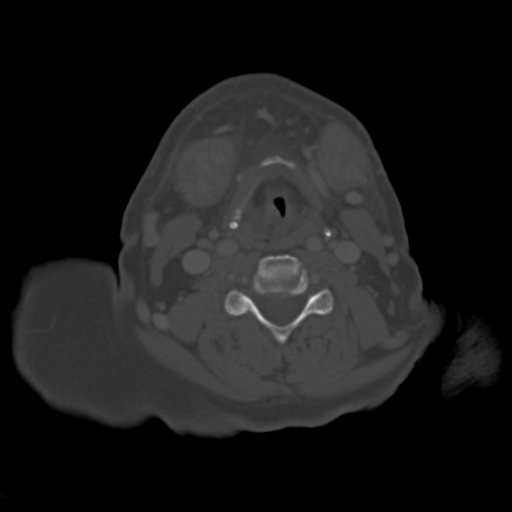

[Series 602: sag · sagittal · 0.48mm/px · 5 of 77 slices shown, 6 images]
[im 26/77  bone]
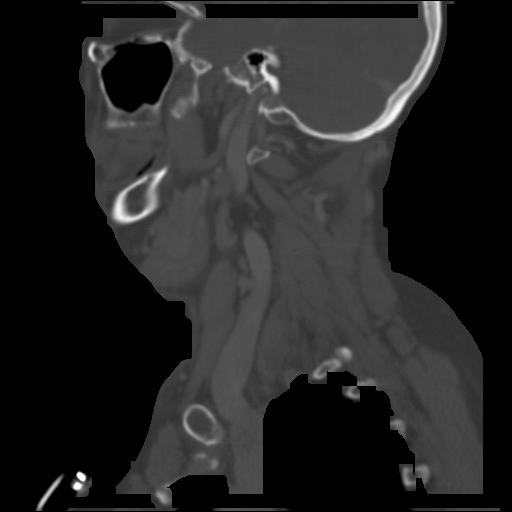
[im 32/77  bone]
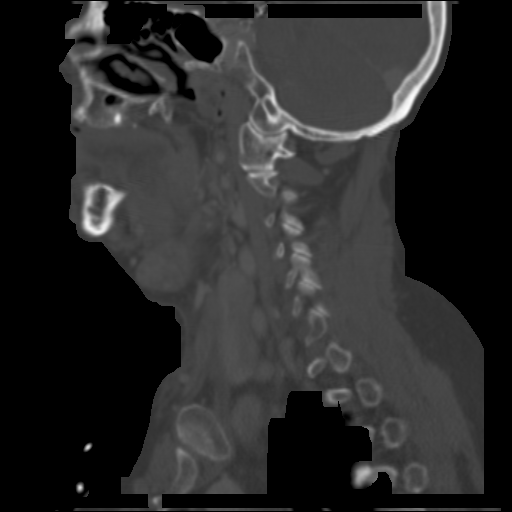
[im 39/77  soft-tissue]
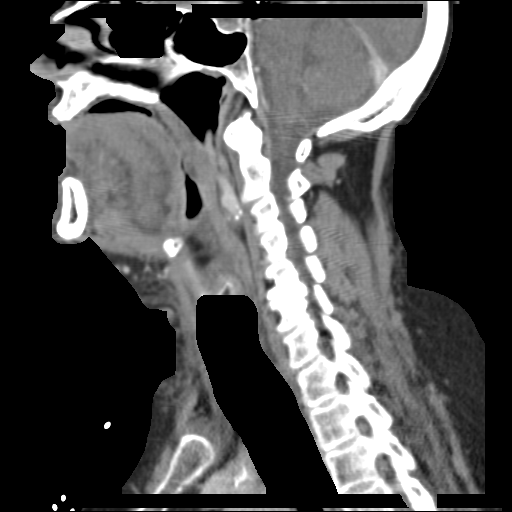
[im 39/77  bone]
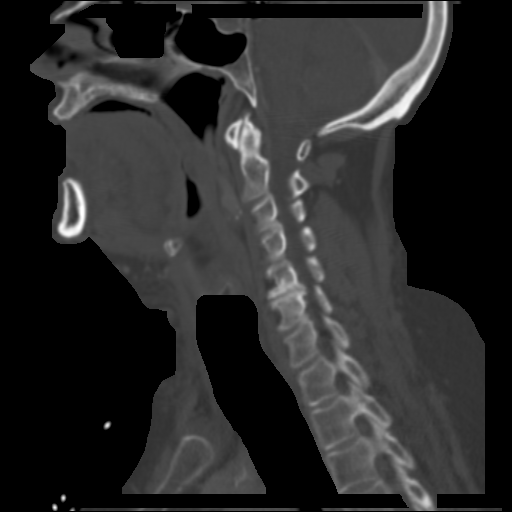
[im 45/77  bone]
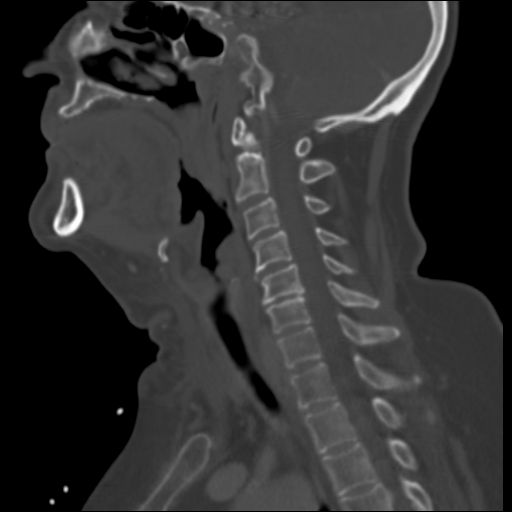
[im 51/77  bone]
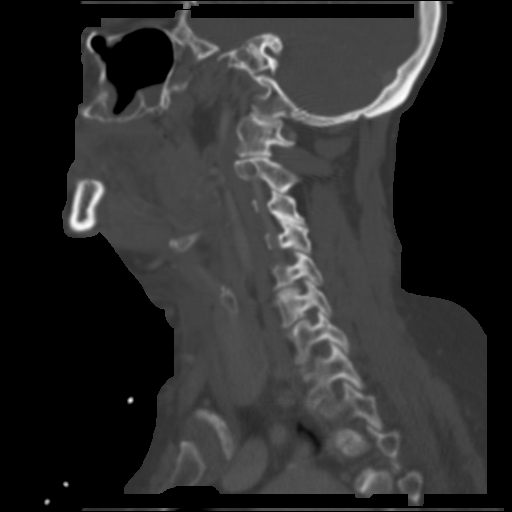

[Series 603: cor · coronal · 0.48mm/px · 3 of 104 slices shown]
[im 22/104  bone]
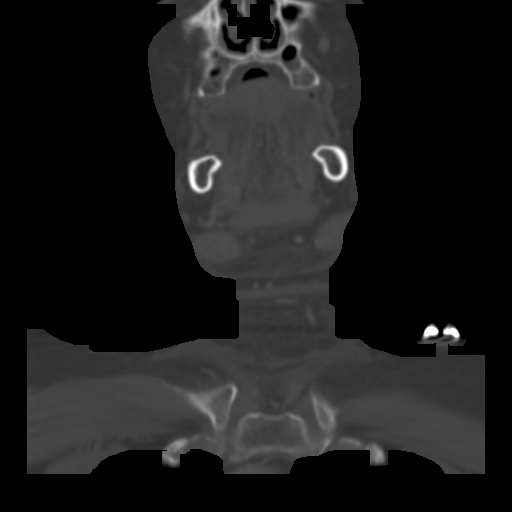
[im 42/104  bone]
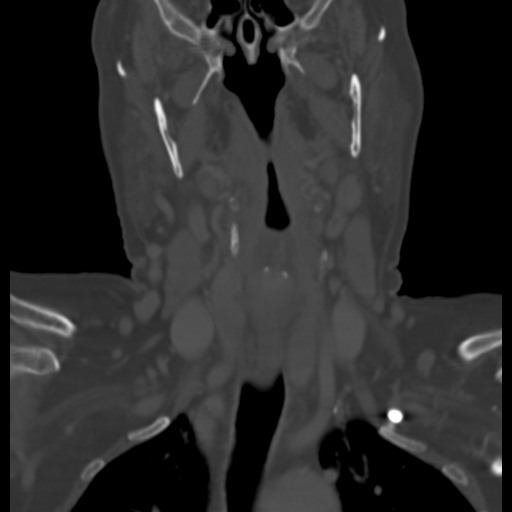
[im 62/104  bone]
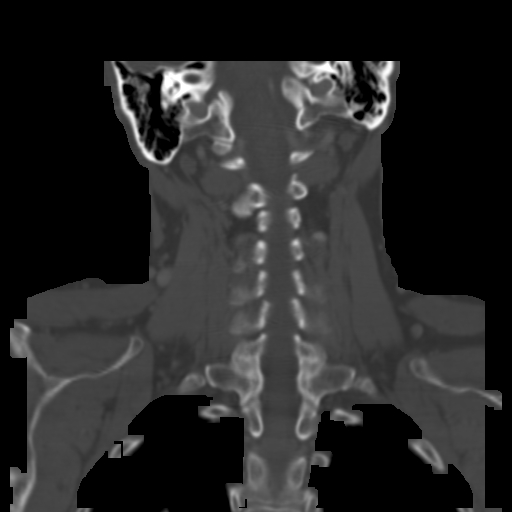

[Series 604: axials · axial · 0.48mm/px · z∈[-296,-108]mm · 5 of 147 slices shown, 7 images]
[im 25/147  soft-tissue]
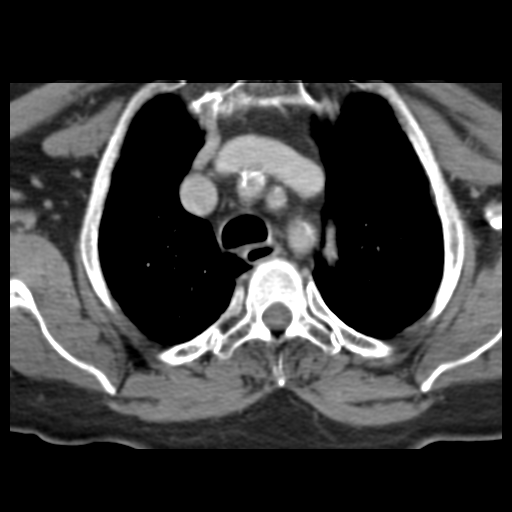
[im 25/147  bone]
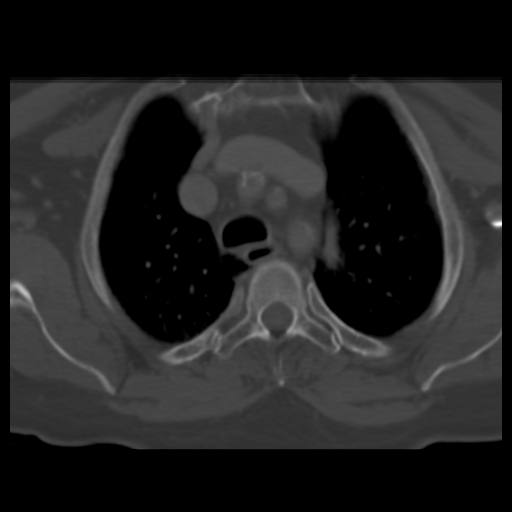
[im 49/147  bone]
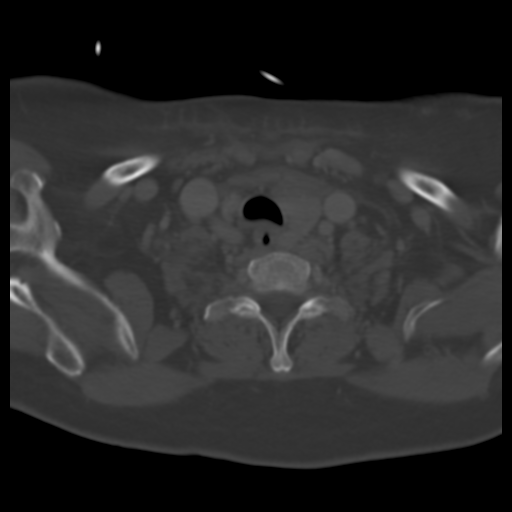
[im 74/147  bone]
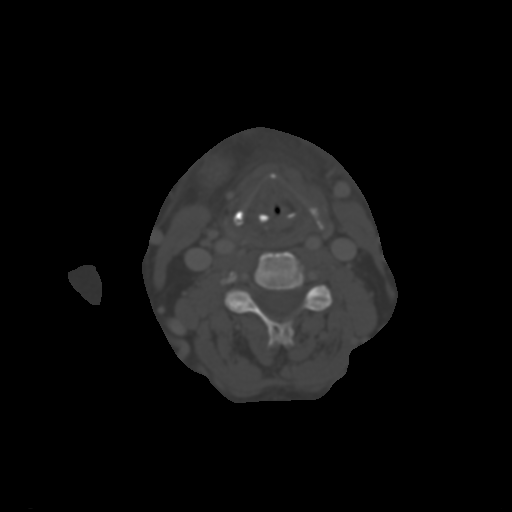
[im 98/147  bone]
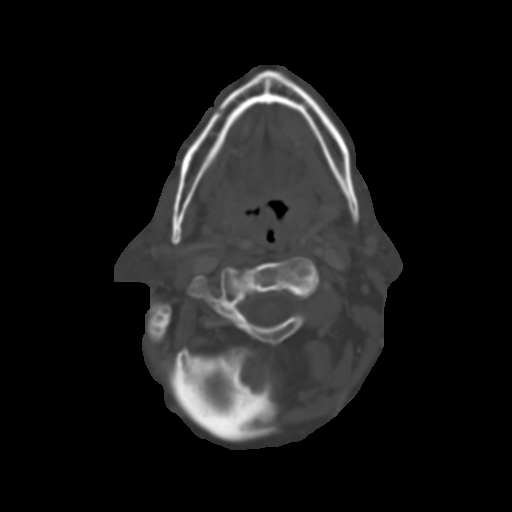
[im 122/147  soft-tissue]
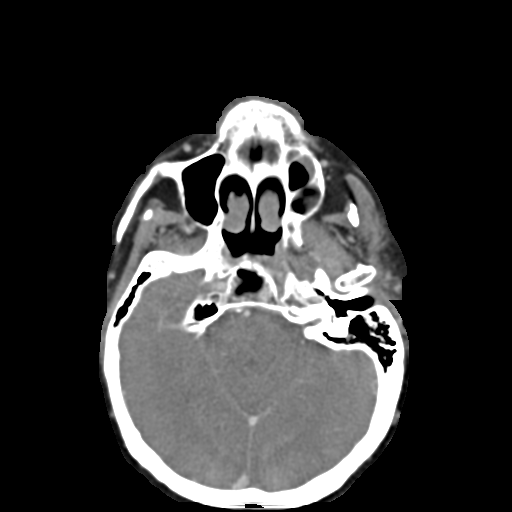
[im 122/147  bone]
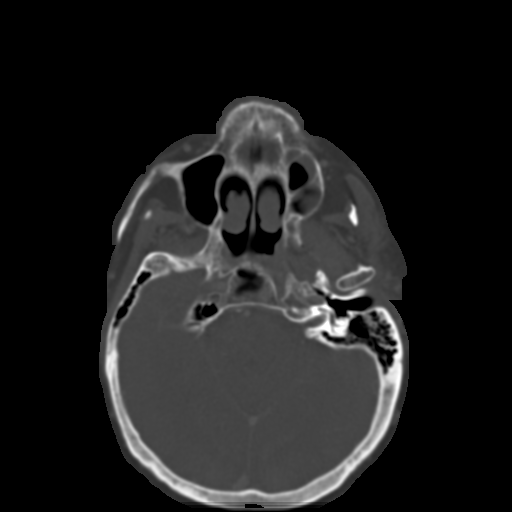

[15 of 33 positions shown; findings below may reference images not displayed]

FINDINGS: Streak artifact from the basilar tip aneurysm coils
noted, similar to prior head CT appearance.

Both submandibular glands appears swollen and heterogeneous.
Parotid glands appear symmetric and within normal limits.

Mucosal thickening noted in the nasal cavity.  There is chronic
left maxillary sinusitis.  The patient is edentulous.

Abnormal fluid density is present in the low supraglottic region
just above the level of the cricoid and arytenoid cartilage, in the
vicinity of the pharyngoesophageal junction, effacing the piriform
sinuses, and less likely to be contained within the sinuses. The
supraglottic airway is abnormally narrowed in this vicinity.  There
is adjacent inflammatory stranding.  The aryepiglottic folds appear
to be somewhat effaced, with borderline thickening of the base of
the epiglottis.

Palatine tonsils and tonsillar pillars appear moderately prominent,
and there is mild prominence of the lingual tonsils.  Low-level
edema tracks deep to the platysma muscle and around the sternohyoid
and sternothyroid right muscles.  No specific thyroid gland
abnormality is observed.

Scattered small reactive lymph nodes are observed without overt
pathologic adenopathy.  No significant abnormal upper mediastinal
lesion.

Emphysema noted in the lung apices.

Loss of disc height and posterior osseous ridging leads to osseous
foraminal stenosis at the C5-6 level, especially on the right side.
IMPRESSION: 1.  Low supraglottic inflammatory process with low density material
effacing the piriform sinuses.  This could be from inflammation,
early abscess formation, or tumor.  THE SUPRAGLOTTIC AIRWAY IN THIS
VICINITY IS ABNORMALLY NARROW AND COULD BE THREATENED.
Laryngoscopy to assess this region and a low clinical threshold for
intubation is recommended.
2.  As part of this process, the aryepiglottic folds are effaced.
There is some mild thickening of the base of the epiglottis
although less epiglottic involvement than would typically be seen
in epiglottitis.
3.  There is abnormal heterogeneous hyperenhancement and expansion
of the submandibular glands without parotid involvement.  Given the
bilaterality, viral sialoadenitis would be suspected.  There is
adjacent abnormal stranding in the adipose tissue deep to the
platysma muscle which tracks inferiorly in this region along the
stylohyoid and stylet thyroid muscles. There is also subcutaneous
edema along the lower neck.
4. Mild hypertrophy of the palatine tonsils and tonsillar pillars.
Minimal if any lymphoid prominence at the base of the tongue.
5.  Emphysema.
6.  Posterior osseous ridging causing osseous foraminal stenosis at
C5-6, right greater than left.
7.  Chronic left maxillary sinusitis.

I discussed these findings by telephone with Dr. Avinash Olaya at
[DATE] p.m. on 09/22/2012.

## 2014-06-18 ENCOUNTER — Telehealth: Payer: Self-pay | Admitting: Family Medicine

## 2014-06-18 ENCOUNTER — Other Ambulatory Visit: Payer: Self-pay | Admitting: *Deleted

## 2014-06-18 MED ORDER — PREDNISONE 1 MG PO TABS: ORAL_TABLET | ORAL | Status: DC

## 2014-06-18 MED ORDER — PREDNISONE 5 MG PO TABS: ORAL_TABLET | ORAL | Status: DC

## 2014-06-18 NOTE — Telephone Encounter (Signed)
Please see documentation on 06/18/2014.  Prescription sent to pharmacy.

## 2014-06-18 NOTE — Telephone Encounter (Signed)
[facility]  Patient requesting refill on her prednisone  [phone]

## 2014-06-18 NOTE — Telephone Encounter (Signed)
Received call from patient.   Reports that she requires refill for prednisone taper.   Advised to decrease Prednisone to 6.5mg  x2 weeks and have patient contact office.   Prescription sent to pharmacy.

## 2014-06-19 ENCOUNTER — Ambulatory Visit (HOSPITAL_COMMUNITY): Payer: Self-pay | Admitting: Psychiatry

## 2014-06-24 ENCOUNTER — Ambulatory Visit (INDEPENDENT_AMBULATORY_CARE_PROVIDER_SITE_OTHER): Payer: Medicare Other | Admitting: Psychiatry

## 2014-06-24 ENCOUNTER — Telehealth: Payer: Self-pay | Admitting: Family Medicine

## 2014-06-24 DIAGNOSIS — F32A Depression, unspecified: Secondary | ICD-10-CM

## 2014-06-24 DIAGNOSIS — F411 Generalized anxiety disorder: Secondary | ICD-10-CM

## 2014-06-24 DIAGNOSIS — F329 Major depressive disorder, single episode, unspecified: Secondary | ICD-10-CM

## 2014-06-24 NOTE — Telephone Encounter (Signed)
Defer to PCP , I would continue current dose until he comes in tomorrow

## 2014-06-24 NOTE — Progress Notes (Signed)
   THERAPIST PROGRESS NOTE  Session Time: Wednesday 06/24/2014 2:15 PM - 3:00 PM  Participation Level: Active  Behavioral Response: CasualAlertAnxious and Depressed /tearful  Type of Therapy: Individual Therapy  Treatment Goals addressed: Establish therapeutic alliance, improve ability to manage stress and anxiety  Interventions: Supportive  Summary: Sanjuana KavaBernadette H Nair is a 74 y.o. female who presents with stress and anxiety she has been experiencing for the past 5+ years per her report. She suspects her 74 year old husband is cheating on her as he is constantly looking at one of their female neighbors and keeping up with her comings and goings per patient's report. She  has left husband 3-4 times because due to her suspicions.  She has confronted husband but he denies any infidelity. Patient suspects he cheated on her in the past with his ex-wife 15-20 years ago. He paid his ex-wife's light bill several years ago and lied when first confronted but did finallly admit when wife found evidence. Patient and husband have been married for 30 years after dating for 21 years. . She has always been suspicious of husband but patient has stayed with him hoping things would work out.  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 Patient reports crying easily. She becomes nervous when going out with husband as she anticipates him looking at other women. Husband also has had  mood swings and irritability since starting on oxygen a year ago. She reports additional stress related to her 73 year old son who has  liver disease and suffers from depression.   Patient reports increased depressed mood, anxiety, and crying spells since last session. She also is experiencing sleep difficulty. She continues to suspect husband may be cheating. Patient's husband attends part of session and says patient has been making accusations against him for most of the marriage  and states things have worsened as patient has become older. He says she has accused him of being involved with certain women, looking at white cars, looking out the window at women, etc. He denies all of these accusations.  Patient shares with therapist she has never caught patient cheating. She reports husband is verbally abusive and can be mean and moody when he doesn't have any money. She reports often feeling guilty for drinking coffee or having certain portions of food on her plate due to negative comments from husband. She has considered going to a women's shelter but states she is tired of running away.   Suicidal/Homicidal: No  Therapist Response: Therapist works with patient to process feelings, identify coping statements, discuss referral to psychiatrist Dr. Tenny Crawoss for medication evaluation, and works with patient and husband to try to facilitate more support for patient  Plan: Return again in 2 weeks. Patient agrees to schedule an appointment with psychiatrist Dr. Tenny Crawoss for medication evaluation.  Diagnosis: Axis I: Depressive disorder NOS, GAD    Axis II: Deferred    Gentry Pilson, LCSW 06/24/2014

## 2014-06-24 NOTE — Telephone Encounter (Signed)
[phone] The pt states that the predniSONE (DELTASONE) is Giving her to many headaches

## 2014-06-24 NOTE — Telephone Encounter (Signed)
Call placed to patient. LMTRC.  

## 2014-06-24 NOTE — Telephone Encounter (Signed)
Call placed to patient to inquire.  Reports that she is having headaches daily after taking her prednisone taper. Reports that she gets generalized headache about 10 minutes after taking prednisone. Patient rates pain at 9.5 out of 10.   Patient states that she has had headache since resuming taper from 04/30/2014.   Per MD notes on 04/30/2014: "I last saw the patient in August. At that time I diagnosed her with PMR (polymyalgia rheumatica) and started her on prednisone. Taking prednisone the pain in her shoulders and in the hips and upper legs completely resolved. She remained pain-free as long she took the prednisone. However the she then abruptly discontinued the medication without my consultation. The pain has returned in her shoulders and her hips and in her upper legs. She also complains of pain in both hands. The pain originates at her wrist and radiates up into her forearm and down into her fingertips. The pain is worse at night and seems to be position related. She denies any injury to her neck." "I would like the patient to begin prednisone 10 mg by mouth daily and recheck with us in 2 weeks. If her pain in her shoulders and in her hips has improved I will start wean her down by 1 mg every 1-2 weeks until she is completely off medication."  Please advise.

## 2014-06-24 NOTE — Patient Instructions (Signed)
Discussed orally 

## 2014-06-25 NOTE — Telephone Encounter (Signed)
Call placed to patient. LMTRC.  

## 2014-06-25 NOTE — Telephone Encounter (Signed)
Call placed to patient and patient made aware.   Patient reports that she stopped taking Prednisone and HA has resolved.   Advised to contact office if pain in joints returns.

## 2014-06-25 NOTE — Telephone Encounter (Signed)
Stop prednisone immediately and NTBS if headaches do not improve in 48 hrs.

## 2014-06-26 NOTE — Telephone Encounter (Signed)
Lets wait and see if muscle aches return.

## 2014-07-02 ENCOUNTER — Telehealth: Payer: Self-pay | Admitting: Family Medicine

## 2014-07-02 MED ORDER — PREDNISONE 5 MG PO TABS: 5.0000 mg | ORAL_TABLET | Freq: Every day | ORAL | Status: AC

## 2014-07-02 NOTE — Telephone Encounter (Signed)
Pt aware and med sent to pharm 

## 2014-07-02 NOTE — Telephone Encounter (Signed)
Patient is calling to let dr pickard know that her pain is back, would like a call back at [phone]

## 2014-07-02 NOTE — Telephone Encounter (Signed)
Begin prednisone 5 qday and if no better recommend rheum consult for PMR.

## 2014-07-15 ENCOUNTER — Telehealth (HOSPITAL_COMMUNITY): Payer: Self-pay | Admitting: *Deleted

## 2014-07-15 NOTE — Telephone Encounter (Signed)
phone call from patient, said you told her that if she needed to be seen to call.  She is on the wait list in case we get a cancellation.

## 2014-07-17 ENCOUNTER — Ambulatory Visit (HOSPITAL_COMMUNITY): Payer: Self-pay | Admitting: Psychiatry

## 2014-07-17 ENCOUNTER — Ambulatory Visit (INDEPENDENT_AMBULATORY_CARE_PROVIDER_SITE_OTHER): Payer: Medicare Other | Admitting: Psychiatry

## 2014-07-17 DIAGNOSIS — F32A Depression, unspecified: Secondary | ICD-10-CM

## 2014-07-17 DIAGNOSIS — F411 Generalized anxiety disorder: Secondary | ICD-10-CM

## 2014-07-17 DIAGNOSIS — F329 Major depressive disorder, single episode, unspecified: Secondary | ICD-10-CM

## 2014-07-17 NOTE — Progress Notes (Signed)
    THERAPIST PROGRESS NOTE  Session Time: Friday 07/17/2014 1:05 PM - 1:55 PM  Participation Level: Active  Behavioral Response: CasualAlertAnxious and Depressed /tearful  Type of Therapy: Individual Therapy  Treatment Goals addressed: Establish therapeutic alliance, improve ability to manage stress and anxiety  Interventions: Supportive  Summary: Jasmine Fields is a 74 y.o. female who presents with stress and anxiety she has been experiencing for the past 5+ years per her report. She suspects her 74 year old husband is cheating on her as he is constantly looking at one of their female neighbors and keeping up with her comings and goings per patient's report. She  has left husband 3-4 times because due to her suspicions.  She has confronted husband but he denies any infidelity. Patient suspects he cheated on her in the past with his ex-wife 15-20 years ago. He paid his ex-wife's light bill several years ago and lied when first confronted but did finallly admit when wife found evidence. Patient and husband have been married for 30 years after dating for 21 years. . She has always been suspicious of husband but patient has stayed with him hoping things would work out.  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 Patient reports crying easily. She becomes nervous when going out with husband as she anticipates him looking at other women. Husband also has had  mood swings and irritability since starting on oxygen a year ago. She reports additional stress related to her 73 year old son who has  liver disease and suffers from depression.   Patient reports continued depressed mood, anxiety, and crying spells since last session. She says husband was attentive and affectionate for about a week after patient's last session. However, he has reverted to negative patterns and has little communication with patient per her report.  She says he  receives phone calls but will not tell patient who is on the phone. She continues to suspect he is involved with other women but also says she could be wrong. She continues to experience frustration and says she hasn't divorced him as he as told he would take her name off paperwork so she couldn't receive his social security benefits at his death. She also states she worries about him. She is planning to visit her siblings in [place] next week. She has told them about marital issues and says they have said they want to help.She doesn't know what they want to do but reports they are very supportive.   Suicidal/Homicidal: No  Therapist Response: Therapist works with patient to process feelings, identify coping statements, explore coping techniques, discuss options regarding researching information regarding her benefits,   Plan: Return again in 2 weeks. Patient will determine what she plans to do about being in [place] before she schedules another appointment. She plans to keep appointment with  Dr. Tenny Crawoss but will cancel if she decides to stay in [place].  Diagnosis: Axis I: Depressive disorder NOS, GAD    Axis II: Deferred    Jasmine Rarick, LCSW 07/17/2014

## 2014-07-17 NOTE — Patient Instructions (Signed)
Discussed orally 

## 2014-07-19 ENCOUNTER — Other Ambulatory Visit: Payer: Self-pay | Admitting: Family Medicine

## 2014-07-20 NOTE — Telephone Encounter (Signed)
Medication filled x1 with no refills.   Requires labs before any further refills can be given.

## 2014-07-22 ENCOUNTER — Ambulatory Visit: Payer: Self-pay | Admitting: Cardiovascular Disease

## 2014-07-24 ENCOUNTER — Other Ambulatory Visit: Payer: Medicare Other

## 2014-07-24 ENCOUNTER — Other Ambulatory Visit: Payer: Self-pay | Admitting: Family Medicine

## 2014-07-24 DIAGNOSIS — E038 Other specified hypothyroidism: Secondary | ICD-10-CM

## 2014-07-24 DIAGNOSIS — I10 Essential (primary) hypertension: Secondary | ICD-10-CM

## 2014-07-24 DIAGNOSIS — E785 Hyperlipidemia, unspecified: Secondary | ICD-10-CM

## 2014-07-24 DIAGNOSIS — Z79899 Other long term (current) drug therapy: Secondary | ICD-10-CM

## 2014-07-27 ENCOUNTER — Other Ambulatory Visit: Payer: Medicare Other

## 2014-07-28 ENCOUNTER — Telehealth (HOSPITAL_COMMUNITY): Payer: Self-pay | Admitting: *Deleted

## 2014-07-28 NOTE — Telephone Encounter (Signed)
noted 

## 2014-07-28 NOTE — Telephone Encounter (Signed)
PHONE CALL from patient to cancel her appointment for 08/06/14.  would not give reason for cancel.  said she would call back.

## 2014-08-06 ENCOUNTER — Ambulatory Visit (HOSPITAL_COMMUNITY): Payer: Self-pay | Admitting: Psychiatry

## 2014-08-18 ENCOUNTER — Telehealth: Payer: Self-pay | Admitting: Family Medicine

## 2014-08-26 DIAGNOSIS — M353 Polymyalgia rheumatica: Secondary | ICD-10-CM | POA: Diagnosis not present

## 2014-08-26 DIAGNOSIS — E039 Hypothyroidism, unspecified: Secondary | ICD-10-CM | POA: Diagnosis not present

## 2014-08-26 DIAGNOSIS — Z6832 Body mass index (BMI) 32.0-32.9, adult: Secondary | ICD-10-CM | POA: Diagnosis not present

## 2014-08-26 DIAGNOSIS — E785 Hyperlipidemia, unspecified: Secondary | ICD-10-CM | POA: Diagnosis not present

## 2014-08-26 DIAGNOSIS — I10 Essential (primary) hypertension: Secondary | ICD-10-CM | POA: Diagnosis not present

## 2014-09-01 DIAGNOSIS — J029 Acute pharyngitis, unspecified: Secondary | ICD-10-CM | POA: Diagnosis not present

## 2014-09-01 DIAGNOSIS — Z6833 Body mass index (BMI) 33.0-33.9, adult: Secondary | ICD-10-CM | POA: Diagnosis not present

## 2014-09-01 DIAGNOSIS — N390 Urinary tract infection, site not specified: Secondary | ICD-10-CM | POA: Diagnosis not present

## 2014-09-01 DIAGNOSIS — J209 Acute bronchitis, unspecified: Secondary | ICD-10-CM | POA: Diagnosis not present

## 2014-09-11 ENCOUNTER — Telehealth (HOSPITAL_COMMUNITY): Payer: Self-pay | Admitting: *Deleted

## 2014-09-11 DIAGNOSIS — M353 Polymyalgia rheumatica: Secondary | ICD-10-CM | POA: Diagnosis not present

## 2014-09-11 NOTE — Telephone Encounter (Signed)
patient called to cancel appointment with Dr. Tenny Crawoss.   No reason given.

## 2014-10-09 ENCOUNTER — Ambulatory Visit (HOSPITAL_COMMUNITY): Payer: Self-pay | Admitting: Psychiatry

## 2014-10-12 ENCOUNTER — Other Ambulatory Visit: Payer: Self-pay | Admitting: Family Medicine

## 2014-10-13 ENCOUNTER — Encounter: Payer: Self-pay | Admitting: Family Medicine

## 2014-10-13 NOTE — Telephone Encounter (Signed)
Med refill x 1.  Pt needs to be seen.  Letter sent °

## 2014-11-09 ENCOUNTER — Ambulatory Visit: Payer: Medicare Other | Admitting: Family Medicine

## 2016-07-10 ENCOUNTER — Encounter: Payer: Self-pay | Admitting: Family Medicine

## 2016-09-26 ENCOUNTER — Encounter: Payer: Self-pay | Admitting: Family Medicine

## 2016-11-09 ENCOUNTER — Encounter: Payer: Self-pay | Admitting: Family Medicine

## 2017-01-18 ENCOUNTER — Encounter: Payer: Self-pay | Admitting: Family Medicine

## 2017-04-02 ENCOUNTER — Telehealth: Payer: Self-pay

## 2017-04-02 NOTE — Telephone Encounter (Signed)
Department: Population Health Management  Reason: Health Maintenance  Patient's chart was reviewed and phone call was placed by Population Coordinator for possible Colorectal cancer screening on 04/02/17 patient due with no open referral.     Outcome:  1. Voicemail    Next steps include:  1. Specialty comments updated

## 2020-02-04 ENCOUNTER — Emergency Department: Payer: Self-pay
